# Patient Record
Sex: Female | Born: 1975 | Race: Black or African American | Hispanic: No | Smoking: Never smoker
Health system: Southern US, Community
[De-identification: ages and names within clinical notes are randomized; demographics above are authoritative.]

## PROBLEM LIST (undated history)

## (undated) DIAGNOSIS — K219 Gastro-esophageal reflux disease without esophagitis: Secondary | ICD-10-CM

## (undated) DIAGNOSIS — N912 Amenorrhea, unspecified: Secondary | ICD-10-CM

## (undated) DIAGNOSIS — Z8719 Personal history of other diseases of the digestive system: Secondary | ICD-10-CM

## (undated) DIAGNOSIS — R7303 Prediabetes: Secondary | ICD-10-CM

## (undated) DIAGNOSIS — G4711 Idiopathic hypersomnia with long sleep time: Secondary | ICD-10-CM

## (undated) DIAGNOSIS — F329 Major depressive disorder, single episode, unspecified: Secondary | ICD-10-CM

## (undated) DIAGNOSIS — T8859XA Other complications of anesthesia, initial encounter: Secondary | ICD-10-CM

## (undated) DIAGNOSIS — F32A Depression, unspecified: Secondary | ICD-10-CM

## (undated) DIAGNOSIS — I10 Essential (primary) hypertension: Secondary | ICD-10-CM

## (undated) DIAGNOSIS — G4730 Sleep apnea, unspecified: Secondary | ICD-10-CM

## (undated) DIAGNOSIS — D649 Anemia, unspecified: Secondary | ICD-10-CM

## (undated) HISTORY — PX: OTHER SURGICAL HISTORY: SHX169

## (undated) HISTORY — DX: Essential (primary) hypertension: I10

## (undated) HISTORY — DX: Amenorrhea, unspecified: N91.2

## (undated) HISTORY — PX: CHOLECYSTECTOMY, LAPAROSCOPIC: SHX56

## (undated) HISTORY — PX: TONSILLECTOMY: SUR1361

## (undated) HISTORY — PX: GASTRIC BYPASS: SHX52

---
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
## 2012-12-24 DIAGNOSIS — R200 Anesthesia of skin: Secondary | ICD-10-CM | POA: Insufficient documentation

## 2012-12-24 DIAGNOSIS — R519 Headache, unspecified: Secondary | ICD-10-CM | POA: Insufficient documentation

## 2013-07-06 HISTORY — PX: GASTRIC BYPASS: SHX52

## 2014-02-12 DIAGNOSIS — D649 Anemia, unspecified: Secondary | ICD-10-CM | POA: Insufficient documentation

## 2014-03-05 DIAGNOSIS — N83209 Unspecified ovarian cyst, unspecified side: Secondary | ICD-10-CM | POA: Insufficient documentation

## 2014-03-16 DIAGNOSIS — R197 Diarrhea, unspecified: Secondary | ICD-10-CM | POA: Insufficient documentation

## 2014-03-20 DIAGNOSIS — E876 Hypokalemia: Secondary | ICD-10-CM | POA: Insufficient documentation

## 2014-09-24 DIAGNOSIS — Z9884 Bariatric surgery status: Secondary | ICD-10-CM | POA: Insufficient documentation

## 2015-02-18 DIAGNOSIS — R87618 Other abnormal cytological findings on specimens from cervix uteri: Secondary | ICD-10-CM | POA: Insufficient documentation

## 2019-01-27 DIAGNOSIS — K458 Other specified abdominal hernia without obstruction or gangrene: Secondary | ICD-10-CM | POA: Insufficient documentation

## 2019-09-01 DIAGNOSIS — N393 Stress incontinence (female) (male): Secondary | ICD-10-CM | POA: Insufficient documentation

## 2019-09-01 DIAGNOSIS — E119 Type 2 diabetes mellitus without complications: Secondary | ICD-10-CM | POA: Insufficient documentation

## 2019-09-01 DIAGNOSIS — G4719 Other hypersomnia: Secondary | ICD-10-CM | POA: Insufficient documentation

## 2019-09-01 DIAGNOSIS — I10 Essential (primary) hypertension: Secondary | ICD-10-CM | POA: Insufficient documentation

## 2019-09-01 DIAGNOSIS — G4733 Obstructive sleep apnea (adult) (pediatric): Secondary | ICD-10-CM | POA: Insufficient documentation

## 2019-09-01 DIAGNOSIS — F32A Depression, unspecified: Secondary | ICD-10-CM | POA: Insufficient documentation

## 2019-09-01 DIAGNOSIS — M1990 Unspecified osteoarthritis, unspecified site: Secondary | ICD-10-CM | POA: Insufficient documentation

## 2019-10-17 ENCOUNTER — Ambulatory Visit: Payer: BC Managed Care – PPO | Admitting: Obstetrics & Gynecology

## 2019-10-17 ENCOUNTER — Encounter: Payer: Self-pay | Admitting: Obstetrics and Gynecology

## 2019-10-17 ENCOUNTER — Other Ambulatory Visit (HOSPITAL_COMMUNITY)
Admission: RE | Admit: 2019-10-17 | Discharge: 2019-10-17 | Disposition: A | Discharge status: Home / Self Care | Payer: BC Managed Care – PPO | Source: Ambulatory Visit | Attending: Obstetrics and Gynecology | Admitting: Obstetrics and Gynecology

## 2019-10-17 ENCOUNTER — Other Ambulatory Visit: Payer: Self-pay

## 2019-10-17 ENCOUNTER — Ambulatory Visit (INDEPENDENT_AMBULATORY_CARE_PROVIDER_SITE_OTHER): Payer: BC Managed Care – PPO | Admitting: Obstetrics and Gynecology

## 2019-10-17 VITALS — BP 148/90 | HR 72 | Ht 67.5 in | Wt 273.0 lb

## 2019-10-17 DIAGNOSIS — N939 Abnormal uterine and vaginal bleeding, unspecified: Secondary | ICD-10-CM

## 2019-10-17 DIAGNOSIS — N8501 Benign endometrial hyperplasia: Secondary | ICD-10-CM | POA: Diagnosis not present

## 2019-10-17 DIAGNOSIS — R87810 Cervical high risk human papillomavirus (HPV) DNA test positive: Secondary | ICD-10-CM | POA: Diagnosis not present

## 2019-10-17 DIAGNOSIS — R87610 Atypical squamous cells of undetermined significance on cytologic smear of cervix (ASC-US): Secondary | ICD-10-CM | POA: Diagnosis not present

## 2019-10-18 LAB — SURGICAL PATHOLOGY

## 2019-10-18 NOTE — Progress Notes (Signed)
Obstetrics & Gynecology Office Visit   Chief Complaint:  Chief Complaint  Patient presents with  . Colposcopy  . Endometrial Biopsy    History of Present Illness:Sharon Zimmerman is a 44 y.o. woman who presents today for consultation of abnormal pap smear at the request of Curtis Sites, MD at [facility] for continued surveillance for history of dysplasia. Last pap obtained on March 2021 revealed ASCUS HPV positive.  In addition the patient underwent an endometrial biopsy for AUB with result showing proliferative endometrium unable to rule out simple hyperplasia without atypia.  She does not report a history of prior abnormal paps or cervical procedures.  She has a long standing history of heavy menstrual cycles lasting up 7-10 days with worsening dysmenorrhea.   Her obstetric history is notable for 3 prior cesarean sections.  She has had a pelvic ultrasound several years ago for concern of uterine fibroids but reports that this was normal.    Review of Systems:Review of Systems  Constitutional: Negative.   Gastrointestinal: Positive for abdominal pain. Negative for blood in stool, constipation, diarrhea, heartburn, melena, nausea and vomiting.  Genitourinary: Negative.   Endo/Heme/Allergies: Does not bruise/bleed easily.     Past Medical History:  There are no problems to display for this patient.   Past Surgical History:  There are no problems to display for this patient.   Gynecologic History: Patient's last menstrual period was 10/09/2019 (exact date).  Obstetric History: G3P3  Family History:  No family history on file.  Social History:  Social History   Socioeconomic History  . Marital status: Unknown    Spouse name: Not on file  . Number of children: Not on file  . Years of education: Not on file  . Highest education level: Not on file  Occupational History  . Not on file  Tobacco Use  . Smoking status: Never Smoker  .  Smokeless tobacco: Never Used  Substance and Sexual Activity  . Alcohol use: Never  . Drug use: Never  . Sexual activity: Not Currently    Birth control/protection: Surgical    Comment: Bilateral Tubal Ligation  Other Topics Concern  . Not on file  Social History Narrative  . Not on file   Social Determinants of Health   Financial Resource Strain:   . Difficulty of Paying Living Expenses:   Food Insecurity:   . Worried About Charity fundraiser in the Last Year:   . Arboriculturist in the Last Year:   Transportation Needs:   . Film/video editor (Medical):   [name] Kitchen Lack of Transportation (Non-Medical):   Physical Activity:   . Days of Exercise per Week:   . Minutes of Exercise per Session:   Stress:   . Feeling of Stress :   Social Connections:   . Frequency of Communication with Friends and Family:   . Frequency of Social Gatherings with Friends and Family:   . Attends Religious Services:   . Active Member of Clubs or Organizations:   . Attends Archivist Meetings:   [name] Kitchen Marital Status:   Intimate Partner Violence:   . Fear of Current or Ex-Partner:   . Emotionally Abused:   [name] Kitchen Physically Abused:   . Sexually Abused:     Allergies:  Allergies  Allergen Reactions  . Lisinopril Cough    Medications: Prior to Admission medications   Medication Sig Start Date End Date Taking? Authorizing Provider  amLODipine (NORVASC) 10  MG tablet Take by mouth.   Yes [provider]  Armodafinil 250 MG tablet Take 250 mg by mouth at bedtime. 09/21/19  Yes [provider]  hydrochlorothiazide (HYDRODIURIL) 25 MG tablet Take 25 mg by mouth every morning. 07/30/19  Yes [provider]  ketoconazole (NIZORAL) 2 % cream APPLY TOPICALLY TO FEET EVERY DAY 09/20/19  Yes [provider]  losartan (COZAAR) 100 MG tablet Take 100 mg by mouth daily. 07/30/19  Yes [provider]  metoprolol succinate (TOPROL-XL) 25 MG 24 hr tablet Take 25 mg by  mouth daily. 07/30/19  Yes [provider]  pantoprazole (PROTONIX) 20 MG tablet Take 20 mg by mouth daily. 09/29/19  Yes [provider]  phentermine 15 MG capsule Take 15 mg by mouth every morning. 09/29/19  Yes [provider]  topiramate (TOPAMAX) 50 MG tablet  09/29/19  Yes [provider]  venlafaxine XR (EFFEXOR-XR) 150 MG 24 hr capsule Take 150 mg by mouth daily. 07/30/19  Yes [provider]    Physical Exam Vitals:  Vitals:   10/17/19 1039  BP: (!) 148/90  Pulse: 72   Patient's last menstrual period was 10/09/2019 (exact date).  General: NAD HEENT: normocephalic, anicteric Pulmonary: No increased work of breathing Genitourinary:  External: Normal external female genitalia.  Normal urethral meatus, normal  Bartholin's and Skene's glands.    Vagina: Normal vaginal mucosa, no evidence of prolapse.    Cervix: Grossly normal in appearance, no bleeding  Uterus: Non-enlarged, mobile, normal contour.  No CMT  Adnexa: ovaries non-enlarged, no adnexal masses  Rectal: deferred  Lymphatic: no evidence of inguinal lymphadenopathy Extremities: no edema, erythema, or tenderness Neurologic: Grossly intact Psychiatric: mood appropriate, affect full  Female chaperone present for pelvic and breast  portions of the physical exam    ENDOMETRIAL BIOPSY     The indications for endometrial biopsy were reviewed.   Risks of the biopsy including cramping, bleeding, infection, uterine perforation, inadequate specimen and need for additional procedures  were discussed. The patient states she understands and agrees to undergo procedure today. Consent was signed. Time out was performed. Urine HCG was negative. A Graves speculum was placed and the cervix was brought into view.  The cervix was prepped with Betadine. A single-toothed tenaculum was  placed on the anterior lip of the cervix for traction. A 3 mm pipelle was introduced through the cervix into the  endometrial cavity without difficulty to a depth of 8 cm, and a moderate amount of tissue was obtained, the resulting specime sent to pathology. The instruments were removed from the patient's vagina. Minimal bleeding from the cervix was noted. The patient tolerated the procedure well. Routine post-procedure instructions were given to the patient.  She will be contacted by phone one results become available.     GYNECOLOGY CLINIC COLPOSCOPY PROCEDURE NOTE  44 y.o. G3P3 here for colposcopy for ASCUS with POSITIVE high risk HPV  pap smear on 09/2019. Discussed underlying role for HPV infection in the development of cervical dysplasia, its natural history and progression/regression, need for surveillance.  Is the patient  pregnant: No LMP: Patient's last menstrual period was 10/09/2019 (exact date). Smoking status:  reports that she has never smoked. She has never used smokeless tobacco.     Patient given informed consent, signed copy in the chart, time out was performed.  The patient was position in dorsal lithotomy position. Speculum was placed the cervix was visualized.   After application of acetic acid colposcopic inspection  of the cervix was undertaken.   Colposcopy adequate, full visualization of transformation zone: Yes acetowhite lesion(s) noted at 1 o'clock; corresponding biopsies obtained.   ECC specimen obtained:  Yes  All specimens were labeled and sent to pathology.   Patient was given post procedure instructions.  Will follow up pathology and manage accordingly.  Routine preventative health maintenance measures emphasized.  Assessment: 44 y.o. G3P3 follow up for ASCUS HPV pap and AUB  Plan: Problem List Items Addressed This Visit    None    Visit Diagnoses    Abnormal uterine bleeding    -  Primary   Relevant Orders   Surgical pathology   US Transvaginal Non-OB   Simple endometrial hyperplasia       Relevant Orders   Surgical pathology   ASCUS with positive high risk  HPV cervical       Relevant Orders   Surgical pathology     1) ASCUS HPV positive pap  - I had a lengthly discussion with Marshea Zimmerman  regarding the cause of dysplasia of the lower genital tract (including immunosuppression in the setting of HPV exposure and tobacco exposure). I explained the potential for progression to invasive malignancy, the recurrent nature of these lesions (and the need for close continued followup). Results of today's pap will dictate need for further evaluation and follow up per ASCCP guidelines..  - We discussed that 80% of the population will have exposure to human papilloma virus (HPV) during their lifetime.  HPV is a large group of viruses, and are also the causative virus for common warts and genital warts.  The pap smear tests for 13 high risk HPV strains that have some association with cervical cancer, but do not cause visual lesion such as warts.  HPV type 16 and 18 have the highest association with cervical cancer.  The vast majority of HPV infections will be uncomplicated at clear spontaneously in 12-18 months in non-immunocompromised patients.  Patient with compromised immune systems, those taking immunosuppressive drugs, or smoker have shown to have a lower clearance rate and higher persistence of HPV infection.    Currently there are no FDA approved treatments to promote HPV clearance.  Gardasil vaccination is available to prevent HPV infection, but this is only beneficial pre-exposure.  Abstaining from intercourse will not increase clearance  Lastly we stressed that if properly followed HPV should not lead to cervical cancer.   The goal of screening is to identify patient who develop precancerous lesions of the cervix and treat these prior to progression to frank cervical cancer.  The incidence of cervical cancer is 7 cases per 100,000 women in the [place] a year.  This relatively low rate is in part due to universal screening as well as vaccination efforts.     - She is comfortable with the plan and had her questions answered.  2) Discussed management options for abnormal uterine bleeding including expectant, NSAIDs, tranexamic acid (Lysteda), oral progesterone (Provera, norethindrone, megace), Depo Provera, Levonorgestrel containing IUD, endometrial ablation (Novasure) or hysterectomy as definitive surgical management.  Discussed risks and benefits of each method.   Final management decision will hinge on results of patient's work up and whether an underlying etiology for the patients bleeding symptoms can be discerned.  We will conduct a basic work up examining using the PALM-COIEN classification system.  Printed patient education handouts were given to the patient to review at home.  Bleeding precautions reviewed.  - endometrial biopsy obtained today to see if definitive  evidence of hyperplasia if present will need to start on progestin therapy.  IUD would be an option although she has significant discomfort with endometrial biopsy although there was no difficulty in entering the uterine cavity - given her history of 3 prior cesarean section adenomyosis is high in the differential -     - A copy of this note was sent to the patient's referring provider  - Return in about 1 week (around 10/24/2019) for TVUS .   Malachy Mood, MD, [facility], [facility] 10/18/2019, 8:46 AM

## 2019-10-30 ENCOUNTER — Other Ambulatory Visit: Payer: Self-pay | Admitting: Physician Assistant

## 2019-10-30 ENCOUNTER — Encounter: Payer: Self-pay | Admitting: Obstetrics and Gynecology

## 2019-10-30 ENCOUNTER — Ambulatory Visit (INDEPENDENT_AMBULATORY_CARE_PROVIDER_SITE_OTHER): Payer: BC Managed Care – PPO | Admitting: Obstetrics and Gynecology

## 2019-10-30 ENCOUNTER — Ambulatory Visit (INDEPENDENT_AMBULATORY_CARE_PROVIDER_SITE_OTHER): Payer: BC Managed Care – PPO

## 2019-10-30 ENCOUNTER — Other Ambulatory Visit: Payer: Self-pay

## 2019-10-30 VITALS — BP 148/90 | HR 93 | Ht 67.5 in | Wt 273.0 lb

## 2019-10-30 DIAGNOSIS — N939 Abnormal uterine and vaginal bleeding, unspecified: Secondary | ICD-10-CM | POA: Diagnosis not present

## 2019-10-30 DIAGNOSIS — L988 Other specified disorders of the skin and subcutaneous tissue: Secondary | ICD-10-CM

## 2019-10-30 NOTE — Progress Notes (Signed)
Gynecology Ultrasound Follow Up  Chief Complaint:  Chief Complaint  Patient presents with  . Follow-up    GYN Ultrasound     History of Present Illness: Patient is a 44 y.o. female who presents today for ultrasound evaluation of abnormal uterine bleeding.  Ultrasound demonstrates the following findgins Adnexa: no masses seen Uterus: Non-enlarged, no evidence of fibroids with endometrial stripe significantly thickened but devoid of focal abnormalities such as polyps Additional: No free fluid  Endometrial biopsy at PCP showed possible simple hyperplasia without atypia.  Sample obtained at the time of colposcopy 10/17/2019 showed proliferative endometrium negative for hyperplasia or malignancy.  Review of Systems: Review of Systems  Constitutional: Negative.   Gastrointestinal: Negative.   Genitourinary: Negative.     Past Medical History:  Past Medical History:  Diagnosis Date  . Hypertension     Past Surgical History:  Past Surgical History:  Procedure Laterality Date  . CESAREAN SECTION  x3  . CHOLECYSTECTOMY, LAPAROSCOPIC    . GASTRIC BYPASS    . left oopherectomy    . TONSILLECTOMY    . torn Rotator cuff      Gynecologic History:  Patient's last menstrual period was 10/09/2019 (exact date).  Family History:  History reviewed. No pertinent family history.  Social History:  Social History   Socioeconomic History  . Marital status: Unknown    Spouse name: Not on file  . Number of children: Not on file  . Years of education: Not on file  . Highest education level: Not on file  Occupational History  . Not on file  Tobacco Use  . Smoking status: Never Smoker  . Smokeless tobacco: Never Used  Substance and Sexual Activity  . Alcohol use: Never  . Drug use: Never  . Sexual activity: Not Currently    Birth control/protection: Surgical    Comment: Bilateral Tubal Ligation  Other Topics Concern  . Not on file  Social History Narrative  . Not on file     Social Determinants of Health   Financial Resource Strain:   . Difficulty of Paying Living Expenses:   Food Insecurity:   . Worried About Charity fundraiser in the Last Year:   . Arboriculturist in the Last Year:   Transportation Needs:   . Film/video editor (Medical):   [name] Kitchen Lack of Transportation (Non-Medical):   Physical Activity:   . Days of Exercise per Week:   . Minutes of Exercise per Session:   Stress:   . Feeling of Stress :   Social Connections:   . Frequency of Communication with Friends and Family:   . Frequency of Social Gatherings with Friends and Family:   . Attends Religious Services:   . Active Member of Clubs or Organizations:   . Attends Archivist Meetings:   [name] Kitchen Marital Status:   Intimate Partner Violence:   . Fear of Current or Ex-Partner:   . Emotionally Abused:   [name] Kitchen Physically Abused:   . Sexually Abused:     Allergies:  Allergies  Allergen Reactions  . Lisinopril Cough    Medications: Prior to Admission medications   Medication Sig Start Date End Date Taking? Authorizing Provider  amLODipine (NORVASC) 10 MG tablet Take by mouth.    [provider]  Armodafinil 250 MG tablet Take 250 mg by mouth at bedtime. 09/21/19   [provider]  hydrochlorothiazide (HYDRODIURIL) 25 MG tablet Take 25 mg by mouth every morning. 07/30/19  [provider]  ketoconazole (NIZORAL) 2 % cream APPLY TOPICALLY TO FEET EVERY DAY 09/20/19   [provider]  losartan (COZAAR) 100 MG tablet Take 100 mg by mouth daily. 07/30/19   [provider]  metoprolol succinate (TOPROL-XL) 25 MG 24 hr tablet Take 25 mg by mouth daily. 07/30/19   [provider]  pantoprazole (PROTONIX) 20 MG tablet Take 20 mg by mouth daily. 09/29/19   [provider]  phentermine 15 MG capsule Take 15 mg by mouth every morning. 09/29/19   [provider]  topiramate (TOPAMAX) 50 MG tablet  09/29/19   [provider]  venlafaxine XR (EFFEXOR-XR) 150 MG 24 hr capsule Take 150 mg by mouth daily. 07/30/19   [provider]    Physical Exam Vitals: Blood pressure (!) 148/90, pulse 93, height 5' 7.5" (1.715 m), weight 273 lb (123.8 kg), last menstrual period 10/09/2019.  General: NAD HEENT: normocephalic, anicteric Pulmonary: No increased work of breathing Extremities: no edema, erythema, or tenderness Neurologic: Grossly intact, normal gait Psychiatric: mood appropriate, affect full  US Transvaginal Non-OB  Result Date: 10/30/2019 Patient Name: Sharon Zimmerman DOB: [birth year] MRN: [id number] ULTRASOUND REPORT Location: [facility] Date of Service: 10/30/2019 Indications:Abnormal Uterine Bleeding Findings: The uterus is anteverted and measures 9.3 x 6.7 x 5.7 cm. Echo texture is heterogenous without evidence of focal masses. The Endometrium measures 23.0 mm. Right Ovary measures 5.1 x 3.4 x 2.7 cm. It is normal in appearance. There is a a corpus luteum in the right ovary. Left oophorectomy. Survey of the adnexa demonstrates no adnexal masses. There is no free fluid in the cul de sac. Impression: 1. The uterus is heterogenous. The endometrium is thick and heterogeneous. No definite fibroids seen. 2. Normal appearing right ovary. 3. Left oophorectomy. Recommendations: 1.Clinical correlation with the patient's History and Physical Exam. Gweneth Dimitri, RT Images reviewed. The endometrium is markedly thickened on today's study. Malachy Mood, MD, [facility], [facility] 10/30/2019, 9:54 AM     Assessment: 44 y.o. G3P3 No problem-specific Assessment & Plan notes found for this encounter.   Plan: Problem List Items Addressed This Visit    None    Visit Diagnoses    Abnormal uterine bleeding    -  Primary   Relevant Orders   TSH+Prl+FSH+TestT+LH+DHEA S...      1) Normal ultrasound other than thickened endometrium. Biopsy negative at time of colposcopy, no evidence  of polyps.  Anticipated next cycle 11/09/2019.  Discussed expectant management, progestins, IUD, D&C +/- IUD or ablation, hysterectomy.  Patient interested in trial of IUD.  Will also obtain some additional hormonal work up given normal ultrasound and endometrial biopsy. - plan on IUD insertion at time of next menses  2) A total of 15 minutes were spent in face-to-face contact with the patient during this encounter with over half of that time devoted to counseling and coordination of care.  3) Return call with next menses for Mirena IUD insertion.    Malachy Mood, MD, [facility], [facility] 10/30/2019, 10:14 AM

## 2019-11-07 LAB — TSH+PRL+FSH+TESTT+LH+DHEA S...
17-Hydroxyprogesterone: 96 ng/dL
Androstenedione: 57 ng/dL (ref 41–262)
DHEA-SO4: 153 ug/dL (ref 57.3–279.2)
FSH: 7.4 m[IU]/mL
LH: 9.1 m[IU]/mL
Prolactin: 8.3 ng/mL (ref 4.8–23.3)
TSH: 1.35 u[IU]/mL (ref 0.450–4.500)
Testosterone, Free: 0.9 pg/mL (ref 0.0–4.2)
Testosterone: 21 ng/dL (ref 8–48)

## 2019-11-10 ENCOUNTER — Other Ambulatory Visit: Payer: Self-pay | Admitting: Physician Assistant

## 2019-11-10 ENCOUNTER — Ambulatory Visit
Admission: RE | Admit: 2019-11-10 | Discharge: 2019-11-10 | Disposition: A | Discharge status: Home / Self Care | Payer: BC Managed Care – PPO | Source: Ambulatory Visit | Attending: Physician Assistant | Admitting: Physician Assistant

## 2019-11-10 DIAGNOSIS — L988 Other specified disorders of the skin and subcutaneous tissue: Secondary | ICD-10-CM

## 2019-11-13 ENCOUNTER — Other Ambulatory Visit: Payer: Self-pay

## 2019-11-13 ENCOUNTER — Ambulatory Visit (INDEPENDENT_AMBULATORY_CARE_PROVIDER_SITE_OTHER): Payer: BC Managed Care – PPO | Admitting: Obstetrics and Gynecology

## 2019-11-13 ENCOUNTER — Telehealth: Payer: Self-pay | Admitting: Obstetrics and Gynecology

## 2019-11-13 ENCOUNTER — Encounter: Payer: Self-pay | Admitting: Obstetrics and Gynecology

## 2019-11-13 VITALS — BP 146/96 | HR 77 | Ht 67.5 in | Wt 276.0 lb

## 2019-11-13 DIAGNOSIS — Z30430 Encounter for insertion of intrauterine contraceptive device: Secondary | ICD-10-CM

## 2019-11-13 NOTE — Progress Notes (Signed)
   GYNECOLOGY OFFICE PROCEDURE NOTE  Sharon Zimmerman is a 44 y.o. G3P3 here for a Mirena IUD insertion. No GYN concerns.  Patient's last menstrual period was 11/10/2019 (exact date)..  The indication for her IUD is cycle control.  IUD Insertion Procedure Note Patient identified, informed consent performed, consent signed.   Discussed risks of irregular bleeding, cramping, infection, malpositioning, expulsion or uterine perforation of the IUD (1:1000 placements)  which may require further procedure such as laparoscopy.  IUD while effective at preventing pregnancy do not prevent transmission of sexually transmitted diseases and use of barrier methods for this purpose was discussed. Time out was performed.  Urine pregnancy test negative.  Speculum placed in the vagina.  Cervix visualized.  Cleaned with Betadine x 2.  Grasped anteriorly with a single tooth tenaculum.  Uterus sounded to 8 cm. IUD placed per manufacturer's recommendations.  Strings trimmed to 3 cm. Tenaculum was removed, good hemostasis noted.  Patient tolerated procedure well.   Patient was given post-procedure instructions.  She was advised to have backup contraception for one week.  Patient was also asked to check IUD strings periodically and follow up in 6 weeks for IUD check.   Malachy Mood, MD, [facility], [place]

## 2019-11-13 NOTE — Telephone Encounter (Signed)
Noted. Mirena reserved for this patient. 

## 2019-11-13 NOTE — Telephone Encounter (Signed)
Patient is schedule for Monday, 11/13/19 at 1:50 with AMS for Mirena placement

## 2019-11-16 ENCOUNTER — Telehealth: Payer: Self-pay | Admitting: Hematology and Oncology

## 2019-11-16 NOTE — Telephone Encounter (Signed)
Received an urgent referral from [place] for severe iron deficency. Ms. Sharon Zimmerman has been cld and scheduled to see Dr. Alvy Bimler on 5/17 at 1:50pm. Pt aware to arrive 15 minutes early.

## 2019-11-20 ENCOUNTER — Inpatient Hospital Stay: Payer: BC Managed Care – PPO | Attending: Hematology and Oncology | Admitting: Hematology and Oncology

## 2019-11-20 ENCOUNTER — Other Ambulatory Visit: Payer: Self-pay

## 2019-11-20 ENCOUNTER — Telehealth: Payer: Self-pay

## 2019-11-20 NOTE — Telephone Encounter (Signed)
PROGRESS NOTE: Patients appointment today was scheduled for 1:50pm. Patient arrived to appointment late due to not knowing where to go. Patient wasn't checked in until 2:20p however Dr Alvy Bimler is now unable to see patient due to not having enough time and having another patient who is here for their appointment. Offered patient option of being able to reschedule with Dr Alvy Bimler tomorrow @12 :40p or Friday @2 :30pm since patient stated that was her next day off of work. Let patient know that she would need to arrive to appointment to check in 30 minutes early. Patient declined both new appointment options and stated that she would call for another appointment.

## 2019-11-20 NOTE — Progress Notes (Deleted)
Patients appointment today was scheduled for 1:50pm. Patient arrived to appointment late due to not knowing where to go. Patient wasn't checked in until 2:20p however Dr Alvy Bimler is now unable to see patient due to not having enough time and having another patient who is here for their appointment. Offered patient option of being able to reschedule with Dr Alvy Bimler tomorrow @12 :40p or Friday @2 :30pm since patient stated that was her next day off of work. Let patient know that she would need to arrive to appointment to check in 30 minutes early. Patient declined both new appointment options and stated that she would call for another appointment.

## 2019-11-28 ENCOUNTER — Ambulatory Visit (HOSPITAL_COMMUNITY): Payer: BC Managed Care – PPO | Admitting: Hematology

## 2019-12-26 ENCOUNTER — Encounter: Payer: Self-pay | Admitting: Obstetrics and Gynecology

## 2019-12-26 ENCOUNTER — Other Ambulatory Visit: Payer: Self-pay

## 2019-12-26 ENCOUNTER — Ambulatory Visit (INDEPENDENT_AMBULATORY_CARE_PROVIDER_SITE_OTHER): Payer: BC Managed Care – PPO | Admitting: Obstetrics and Gynecology

## 2019-12-26 ENCOUNTER — Ambulatory Visit (INDEPENDENT_AMBULATORY_CARE_PROVIDER_SITE_OTHER): Payer: BC Managed Care – PPO

## 2019-12-26 VITALS — BP 136/80 | HR 72 | Ht 67.5 in | Wt 281.0 lb

## 2019-12-26 DIAGNOSIS — Z30431 Encounter for routine checking of intrauterine contraceptive device: Secondary | ICD-10-CM | POA: Diagnosis not present

## 2019-12-26 DIAGNOSIS — N939 Abnormal uterine and vaginal bleeding, unspecified: Secondary | ICD-10-CM | POA: Diagnosis not present

## 2019-12-26 NOTE — Progress Notes (Signed)
Obstetrics & Gynecology Office Visit   Chief Complaint:  Chief Complaint  Patient presents with  . IUD check    bleeding and cramping    History of Present Illness: 44 y.o. patient presenting for follow up of Mirena IUD placement 6 weeks ago.  The indication for her IUD was cycle control.  She denies any complications since her IUD placement.  Still having some vaginal bleeding and cramping since initial IUD placement.  Review of Systems: Review of Systems  Constitutional: Negative.   Gastrointestinal: Negative.   Genitourinary: Negative.     Past Medical History:  Past Medical History:  Diagnosis Date  . Hypertension     Past Surgical History:  Past Surgical History:  Procedure Laterality Date  . CESAREAN SECTION  x3  . CHOLECYSTECTOMY, LAPAROSCOPIC    . GASTRIC BYPASS    . left oopherectomy    . TONSILLECTOMY    . torn Rotator cuff      Gynecologic History: Patient's last menstrual period was 12/01/2019 (exact date).  Obstetric History: G3P3  Family History:  No family history on file.  Social History:  Social History   Socioeconomic History  . Marital status: Unknown    Spouse name: Not on file  . Number of children: Not on file  . Years of education: Not on file  . Highest education level: Not on file  Occupational History  . Not on file  Tobacco Use  . Smoking status: Never Smoker  . Smokeless tobacco: Never Used  Vaping Use  . Vaping Use: Never used  Substance and Sexual Activity  . Alcohol use: Never  . Drug use: Never  . Sexual activity: Not Currently    Birth control/protection: Surgical    Comment: Bilateral Tubal Ligation  Other Topics Concern  . Not on file  Social History Narrative  . Not on file   Social Determinants of Health   Financial Resource Strain:   . Difficulty of Paying Living Expenses:   Food Insecurity:   . Worried About Charity fundraiser in the Last Year:   . Arboriculturist in the Last Year:     Transportation Needs:   . Film/video editor (Medical):   [name] Kitchen Lack of Transportation (Non-Medical):   Physical Activity:   . Days of Exercise per Week:   . Minutes of Exercise per Session:   Stress:   . Feeling of Stress :   Social Connections:   . Frequency of Communication with Friends and Family:   . Frequency of Social Gatherings with Friends and Family:   . Attends Religious Services:   . Active Member of Clubs or Organizations:   . Attends Archivist Meetings:   [name] Kitchen Marital Status:   Intimate Partner Violence:   . Fear of Current or Ex-Partner:   . Emotionally Abused:   [name] Kitchen Physically Abused:   . Sexually Abused:     Allergies:  Allergies  Allergen Reactions  . Lisinopril Cough    Medications: Prior to Admission medications   Medication Sig Start Date End Date Taking? Authorizing Provider  amLODipine (NORVASC) 10 MG tablet Take by mouth.    [provider]  Armodafinil 250 MG tablet Take 250 mg by mouth at bedtime. 09/21/19   [provider]  hydrochlorothiazide (HYDRODIURIL) 25 MG tablet Take 25 mg by mouth every morning. 07/30/19   [provider]  ketoconazole (NIZORAL) 2 % cream APPLY TOPICALLY TO FEET EVERY DAY 09/20/19  [provider]  losartan (COZAAR) 100 MG tablet Take 100 mg by mouth daily. 07/30/19   [provider]  metoprolol succinate (TOPROL-XL) 25 MG 24 hr tablet Take 25 mg by mouth daily. 07/30/19   [provider]  pantoprazole (PROTONIX) 20 MG tablet Take 20 mg by mouth daily. 09/29/19   [provider]  phentermine 15 MG capsule Take 15 mg by mouth every morning. 09/29/19   [provider]  topiramate (TOPAMAX) 50 MG tablet  09/29/19   [provider]  venlafaxine XR (EFFEXOR-XR) 150 MG 24 hr capsule Take 150 mg by mouth daily. 07/30/19   [provider]    Physical Exam Vitals: Blood pressure 136/80, pulse 72, height 5' 7.5" (1.715 m), weight 281 lb  (127.5 kg), last menstrual period 12/01/2019.  Patient's last menstrual period was 12/01/2019 (exact date).  General: NAD HEENT: normocephalic, anicteric Pulmonary: No increased work of breathing  Genitourinary:  External: Normal external female genitalia.  Normal urethral meatus, normal  Bartholin's and Skene's glands.    Vagina: Normal vaginal mucosa, no evidence of prolapse.    Cervix: Grossly normal in appearance, no bleeding, IUD strings visualized 2cm  Uterus: Non-enlarged, mobile, normal contour.  No CMT  Adnexa: ovaries non-enlarged, no adnexal masses  Rectal: deferred  Lymphatic: no evidence of inguinal lymphadenopathy Extremities: no edema, erythema, or tenderness Neurologic: Grossly intact Psychiatric: mood appropriate, affect full  Female chaperone present for pelvic and breast  portions of the physical exam  US Transvaginal Non-OB  Result Date: 12/26/2019 Patient Name: Sharon Zimmerman DOB: 03-23-1976 MRN: [id number] ULTRASOUND REPORT Location: [facility] Date of Service: 12/26/2019 Indications:Abnormal Uterine Bleeding Findings: The uterus is anteverted and measures 10.0 x 6.4 x 5.4 cm. Echo texture is heterogenous without evidence of focal masses. The Endometrium measures 7.4 mm. The endometrium is somewhat hard to define. The IUD is correctly placed within the uterus. Right Ovary measures 3.5 x 3.2 x 2.3 cm. It is normal in appearance. Left oophorectomy. Survey of the adnexa demonstrates no adnexal masses. There is no free fluid in the cul de sac. Impression: 1. The IUD is correctly placed within the uterus. 2. The endometrium is slightly hard to define and somewhat thick. 3. Normal appearing right ovary. 4. Left oophorectomy Recommendations: 1.Clinical correlation with the patient's History and Physical Exam. Gweneth Dimitri, RT Images reviewed.  Normal GYN study without visualized pathology. IUD in proper position. Malachy Mood, MD, [facility], [facility] 12/26/2019, 10:50 AM     Assessment: 44 y.o. G3P3 No problem-specific Assessment & Plan notes found for this encounter.   Plan: Problem List Items Addressed This Visit    None    Visit Diagnoses    Abnormal uterine bleeding    -  Primary   Relevant Orders   US Transvaginal Non-OB   Encounter for routine checking of intrauterine contraceptive device (IUD)       Relevant Orders   US Transvaginal Non-OB       1.  The patient was given instructions to check her IUD strings monthly and call with any problems or concerns.  She should call for fevers, chills, abnormal vaginal discharge, pelvic pain, or other complaints.  2.   IUDs while effective at preventing pregnancy do not prevent transmission of sexually transmitted diseases and use of barrier methods for this purpose was discussed.  Low overall incidence of failure with 99.7% efficacy rate in typical use.  The patient has not contraindication to  IUD placement.  3.  She will return  in 4 week to reassess bleeding pattern lining thinned out significantly 7.69mm compared to 53mm originally with IUD in good location within the uterine cavity.  4) A total of 15 minutes were spent in face-to-face contact with the patient during this encounter with over half of that time devoted to counseling and coordination of care.  5) Return in about 1 week (around 01/02/2020) for TVUS IUD location and follow up (can be today or later this week).   Malachy Mood, MD, [facility], [facility] 12/26/2019, 9:25 AM

## 2020-01-23 ENCOUNTER — Other Ambulatory Visit: Payer: Self-pay

## 2020-01-23 ENCOUNTER — Ambulatory Visit (INDEPENDENT_AMBULATORY_CARE_PROVIDER_SITE_OTHER): Payer: BC Managed Care – PPO | Admitting: Obstetrics and Gynecology

## 2020-01-23 ENCOUNTER — Encounter: Payer: Self-pay | Admitting: Obstetrics and Gynecology

## 2020-01-23 VITALS — BP 138/90 | HR 83 | Wt 279.0 lb

## 2020-01-23 DIAGNOSIS — Z30431 Encounter for routine checking of intrauterine contraceptive device: Secondary | ICD-10-CM | POA: Diagnosis not present

## 2020-01-23 DIAGNOSIS — N939 Abnormal uterine and vaginal bleeding, unspecified: Secondary | ICD-10-CM | POA: Diagnosis not present

## 2020-01-23 NOTE — H&P (View-Only) (Signed)
Obstetrics & Gynecology Office Visit   Chief Complaint:  Chief Complaint  Patient presents with  . Follow-up    IUD removal, continuously bleeding    History of Present Illness: 44 y.o. patient presenting for follow up of Mirena IUD placement on 11/13/2019.  The indication for her IUD was cycle control.  She admits to any complications since her IUD placement.  Still having daily bleedingl. She is able to feel strings.    Endometrial biopsy 10/17/2019 benign proliferative endometrium  Colposcopy 10/17/2019 [facility] I  Ultrasound 12/26/2019 normal, confirming correct IUD placement  Review of Systems: Review of Systems  Constitutional: Negative.   Gastrointestinal: Negative.   Genitourinary: Negative.     Past Medical History:  Past Medical History:  Diagnosis Date  . Hypertension     Past Surgical History:  Past Surgical History:  Procedure Laterality Date  . CESAREAN SECTION  x3  . CHOLECYSTECTOMY, LAPAROSCOPIC    . GASTRIC BYPASS    . left oopherectomy    . TONSILLECTOMY    . torn Rotator cuff      Gynecologic History: No LMP recorded. (Menstrual status: IUD).  Obstetric History: G3P3  Family History:  History reviewed. No pertinent family history.  Social History:  Social History   Socioeconomic History  . Marital status: Unknown    Spouse name: Not on file  . Number of children: Not on file  . Years of education: Not on file  . Highest education level: Not on file  Occupational History  . Not on file  Tobacco Use  . Smoking status: Never Smoker  . Smokeless tobacco: Never Used  Vaping Use  . Vaping Use: Never used  Substance and Sexual Activity  . Alcohol use: Never  . Drug use: Never  . Sexual activity: Not Currently    Birth control/protection: Surgical    Comment: Bilateral Tubal Ligation  Other Topics Concern  . Not on file  Social History Narrative  . Not on file   Social Determinants of Health   Financial Resource Strain:   .  Difficulty of Paying Living Expenses:   Food Insecurity:   . Worried About Charity fundraiser in the Last Year:   . Arboriculturist in the Last Year:   Transportation Needs:   . Film/video editor (Medical):   [name] Kitchen Lack of Transportation (Non-Medical):   Physical Activity:   . Days of Exercise per Week:   . Minutes of Exercise per Session:   Stress:   . Feeling of Stress :   Social Connections:   . Frequency of Communication with Friends and Family:   . Frequency of Social Gatherings with Friends and Family:   . Attends Religious Services:   . Active Member of Clubs or Organizations:   . Attends Archivist Meetings:   [name] Kitchen Marital Status:   Intimate Partner Violence:   . Fear of Current or Ex-Partner:   . Emotionally Abused:   [name] Kitchen Physically Abused:   . Sexually Abused:     Allergies:  Allergies  Allergen Reactions  . Lisinopril Cough    Medications: Prior to Admission medications   Medication Sig Start Date End Date Taking? Authorizing Provider  amLODipine (NORVASC) 10 MG tablet Take by mouth.   Yes [provider]  Armodafinil 250 MG tablet Take 250 mg by mouth at bedtime. 09/21/19  Yes [provider]  hydrochlorothiazide (HYDRODIURIL) 25 MG tablet Take 25 mg by mouth every morning. 07/30/19  Yes [provider]  ketoconazole (NIZORAL) 2 % cream APPLY TOPICALLY TO FEET EVERY DAY 09/20/19  Yes [provider]  losartan (COZAAR) 100 MG tablet Take 100 mg by mouth daily. 07/30/19  Yes [provider]  metoprolol succinate (TOPROL-XL) 25 MG 24 hr tablet Take 25 mg by mouth daily. 07/30/19  Yes [provider]  pantoprazole (PROTONIX) 20 MG tablet Take 20 mg by mouth daily. 09/29/19  Yes [provider]  QSYMIA 7.5-46 MG CP24 Take 1 capsule by mouth daily. 11/10/19  Yes [provider]  venlafaxine XR (EFFEXOR-XR) 150 MG 24 hr capsule Take 150 mg by mouth daily. 07/30/19  Yes [provider]     Physical Exam Blood pressure 138/90, pulse 83, weight 279 lb (126.6 kg). No LMP recorded. (Menstrual status: IUD).  General: NAD, well nourished, appears stated age 68: normocephalic, anicteric Pulmonary: No increased work of breathing, CTAB Cardiovascular: RRR Abdomen: soft, non-tender, non-distended Extremities: no edema, erythema, or tenderness Neurologic: Grossly intact Psychiatric: mood appropriate, affect full   Assessment: 44 y.o. G3P3 follow up continued AUB on Mirena IUD  Plan: Problem List Items Addressed This Visit    None    Visit Diagnoses    Abnormal uterine bleeding    -  Primary   IUD check up           1.  AUB - continued AUB on Mirena IUD.  Discussed continued expectant management, IUD removal, switching to oral progestin/depo provera, or Novasure endometrial ablation  - Hysteroscopy, mirena iud removal, and novasure - Consents signed today. - Surgery scheduling request  2) A total of 15 minutes were spent in face-to-face contact with the patient during this encounter with over half of that time devoted to counseling and coordination of care.  3) Return will be called with srugery date.   Malachy Mood, MD, [facility], [place]

## 2020-01-23 NOTE — Progress Notes (Signed)
Obstetrics & Gynecology Office Visit   Chief Complaint:  Chief Complaint  Patient presents with  . Follow-up    IUD removal, continuously bleeding    History of Present Illness: 44 y.o. patient presenting for follow up of Mirena IUD placement on 11/13/2019.  The indication for her IUD was cycle control.  She admits to any complications since her IUD placement.  Still having daily bleedingl. She is able to feel strings.    Endometrial biopsy 10/17/2019 benign proliferative endometrium  Colposcopy 10/17/2019 [facility] I  Ultrasound 12/26/2019 normal, confirming correct IUD placement  Review of Systems: Review of Systems  Constitutional: Negative.   Gastrointestinal: Negative.   Genitourinary: Negative.     Past Medical History:  Past Medical History:  Diagnosis Date  . Hypertension     Past Surgical History:  Past Surgical History:  Procedure Laterality Date  . CESAREAN SECTION  x3  . CHOLECYSTECTOMY, LAPAROSCOPIC    . GASTRIC BYPASS    . left oopherectomy    . TONSILLECTOMY    . torn Rotator cuff      Gynecologic History: No LMP recorded. (Menstrual status: IUD).  Obstetric History: G3P3  Family History:  History reviewed. No pertinent family history.  Social History:  Social History   Socioeconomic History  . Marital status: Unknown    Spouse name: Not on file  . Number of children: Not on file  . Years of education: Not on file  . Highest education level: Not on file  Occupational History  . Not on file  Tobacco Use  . Smoking status: Never Smoker  . Smokeless tobacco: Never Used  Vaping Use  . Vaping Use: Never used  Substance and Sexual Activity  . Alcohol use: Never  . Drug use: Never  . Sexual activity: Not Currently    Birth control/protection: Surgical    Comment: Bilateral Tubal Ligation  Other Topics Concern  . Not on file  Social History Narrative  . Not on file   Social Determinants of Health   Financial Resource Strain:   .  Difficulty of Paying Living Expenses:   Food Insecurity:   . Worried About Charity fundraiser in the Last Year:   . Arboriculturist in the Last Year:   Transportation Needs:   . Film/video editor (Medical):   [name] Kitchen Lack of Transportation (Non-Medical):   Physical Activity:   . Days of Exercise per Week:   . Minutes of Exercise per Session:   Stress:   . Feeling of Stress :   Social Connections:   . Frequency of Communication with Friends and Family:   . Frequency of Social Gatherings with Friends and Family:   . Attends Religious Services:   . Active Member of Clubs or Organizations:   . Attends Archivist Meetings:   [name] Kitchen Marital Status:   Intimate Partner Violence:   . Fear of Current or Ex-Partner:   . Emotionally Abused:   [name] Kitchen Physically Abused:   . Sexually Abused:     Allergies:  Allergies  Allergen Reactions  . Lisinopril Cough    Medications: Prior to Admission medications   Medication Sig Start Date End Date Taking? Authorizing Provider  amLODipine (NORVASC) 10 MG tablet Take by mouth.   Yes [provider]  Armodafinil 250 MG tablet Take 250 mg by mouth at bedtime. 09/21/19  Yes [provider]  hydrochlorothiazide (HYDRODIURIL) 25 MG tablet Take 25 mg by mouth every morning. 07/30/19  Yes [provider]  ketoconazole (NIZORAL) 2 % cream APPLY TOPICALLY TO FEET EVERY DAY 09/20/19  Yes [provider]  losartan (COZAAR) 100 MG tablet Take 100 mg by mouth daily. 07/30/19  Yes [provider]  metoprolol succinate (TOPROL-XL) 25 MG 24 hr tablet Take 25 mg by mouth daily. 07/30/19  Yes [provider]  pantoprazole (PROTONIX) 20 MG tablet Take 20 mg by mouth daily. 09/29/19  Yes [provider]  QSYMIA 7.5-46 MG CP24 Take 1 capsule by mouth daily. 11/10/19  Yes [provider]  venlafaxine XR (EFFEXOR-XR) 150 MG 24 hr capsule Take 150 mg by mouth daily. 07/30/19  Yes [provider]     Physical Exam Blood pressure 138/90, pulse 83, weight 279 lb (126.6 kg). No LMP recorded. (Menstrual status: IUD).  General: NAD, well nourished, appears stated age 32: normocephalic, anicteric Pulmonary: No increased work of breathing, CTAB Cardiovascular: RRR Abdomen: soft, non-tender, non-distended Extremities: no edema, erythema, or tenderness Neurologic: Grossly intact Psychiatric: mood appropriate, affect full   Assessment: 44 y.o. G3P3 follow up continued AUB on Mirena IUD  Plan: Problem List Items Addressed This Visit    None    Visit Diagnoses    Abnormal uterine bleeding    -  Primary   IUD check up           1.  AUB - continued AUB on Mirena IUD.  Discussed continued expectant management, IUD removal, switching to oral progestin/depo provera, or Novasure endometrial ablation  - Hysteroscopy, mirena iud removal, and novasure - Consents signed today. - Surgery scheduling request  2) A total of 15 minutes were spent in face-to-face contact with the patient during this encounter with over half of that time devoted to counseling and coordination of care.  3) Return will be called with srugery date.   Malachy Mood, MD, [facility], [place]

## 2020-01-30 ENCOUNTER — Telehealth: Payer: Self-pay

## 2020-01-30 NOTE — Telephone Encounter (Signed)
IUD is in correct position based on prior exam and ultrasound, we can take it out anytime that I have an opening

## 2020-01-30 NOTE — Telephone Encounter (Signed)
Patient is scheduled for 02/08/20 with AMS in [facility]

## 2020-01-30 NOTE — Telephone Encounter (Signed)
Pt calling stating she is having bleeding and pain on right side. She states she was supposed to have the IUD taken out on 7/20 at her visit with AMS. However, the plan was to wait it out. She has since then had bleeding which has lightened up but the cramping is still on her right side. She has noticed nausea, however, is not concerned about that as much d/t her eating habits has changed.   She is aware AMS is not in the office today but that I will send him a message.

## 2020-01-30 NOTE — Telephone Encounter (Signed)
Please schedule at next available appointment. Not urgent

## 2020-02-01 ENCOUNTER — Telehealth: Payer: Self-pay | Admitting: Obstetrics and Gynecology

## 2020-02-01 NOTE — Telephone Encounter (Signed)
-----   Message from Malachy Mood, MD sent at 01/23/2020  7:47 PM EDT ----- Regarding: Surgery Surgery Booking Request Patient Full Name:  Sharon Zimmerman  MRN: [id number]  DOB: [birth year]  Surgeon: Malachy Mood, MD  Requested Surgery Date and Time: 2-4 weeks Primary Diagnosis AND Code: AUB N93.9  Secondary Diagnosis and Code:  Surgical Procedure: hysteroscopy, novasure endometrial ablation, mirena IUD removal RNFA Requested?: No L&D Notification: No Admission Status: same day surgery Length of Surgery: 50 min Special Case Needs: No H&P: Done 01/23/2020 Phone Interview???:  No Interpreter: No Medical Clearance:  No Special Scheduling Instructions: No Any known health/anesthesia issues, diabetes, sleep apnea, latex allergy, defibrillator/pacemaker?: No Acuity: P2   (P1 highest, P2 delay may cause harm, P3 low, elective gyn, P4 lowest)

## 2020-02-08 ENCOUNTER — Ambulatory Visit: Payer: BC Managed Care – PPO | Admitting: Obstetrics and Gynecology

## 2020-02-13 ENCOUNTER — Encounter
Admission: RE | Admit: 2020-02-13 | Discharge: 2020-02-13 | Disposition: A | Discharge status: Home / Self Care | Payer: BC Managed Care – PPO | Source: Ambulatory Visit | Attending: Obstetrics and Gynecology | Admitting: Obstetrics and Gynecology

## 2020-02-13 ENCOUNTER — Other Ambulatory Visit: Payer: Self-pay

## 2020-02-13 HISTORY — DX: Idiopathic hypersomnia with long sleep time: G47.11

## 2020-02-13 HISTORY — DX: Gastro-esophageal reflux disease without esophagitis: K21.9

## 2020-02-13 HISTORY — DX: Depression, unspecified: F32.A

## 2020-02-13 HISTORY — DX: Personal history of other diseases of the digestive system: Z87.19

## 2020-02-13 NOTE — Patient Instructions (Signed)
COVID TESTING Date: February 19, 2020 Monday  Testing site:  [facility] [address] Thru Hours:  0:86 am - 1:00 pm Once you are tested, you are asked to stay quarantined (avoiding public places) until after your surgery.   Your procedure is scheduled on: February 20, 2020 Report to Day Surgery on the 2nd floor of the [facility]. To find out your arrival time, please call [phone] between 1PM - 3PM on: February 19, 2020  REMEMBER: Instructions that are not followed completely may result in serious medical risk, up to and including death; or upon the discretion of your surgeon and anesthesiologist your surgery may need to be rescheduled.  Do not eat food after midnight the night before surgery.  No gum chewing, lozengers or hard candies.  You may however, drink CLEAR liquids up to 2 hours before you are scheduled to arrive for your surgery. Do not drink anything within 2 hours of your scheduled arrival time.  Clear liquids include: - water  - apple juice without pulp - gatorade (not RED) - black coffee or tea (Do NOT add milk or creamers to the coffee or tea) Do NOT drink anything that is not on this list.  Type 1 and Type 2 diabetics should only drink water.  TAKE THESE MEDICATIONS THE MORNING OF SURGERY WITH A SIP OF WATER: METOPROLOL PANTOPRAZOLE (take one the night before and one on the morning of surgery - helps to prevent nausea after surgery.)  Stop Anti-inflammatories (NSAIDS) such as Advil, Aleve, Ibuprofen, Motrin, Naproxen, Naprosyn and ASPIRIN OR Aspirin based products such as Excedrin, Goodys Powder, BC Powder. (May take Tylenol or Acetaminophen if needed.)  Stop ANY OVER THE COUNTER supplements until after surgery. (May continue Vitamin D, Vitamin B, and multivitamin.)  No Alcohol for 24 hours before or after surgery.  No Smoking including e-cigarettes for 24 hours prior to surgery.  No chewable tobacco products for at least 6  hours prior to surgery.  No nicotine patches on the day of surgery.  Do not use any "recreational" drugs for at least a week prior to your surgery.  Please be advised that the combination of cocaine and anesthesia may have negative outcomes, up to and including death. If you test positive for cocaine, your surgery will be cancelled.  On the morning of surgery brush your teeth with toothpaste and water, you may rinse your mouth with mouthwash if you wish. Do not swallow any toothpaste or mouthwash.  Do not wear jewelry, make-up, hairpins, clips or nail polish.  Do not wear lotions, powders, or perfumes.   Do not shave 48 hours prior to surgery.   Contact lenses, hearing aids and dentures may not be worn into surgery.  Do not bring valuables to the hospital. [facility] is not responsible for any missing/lost belongings or valuables.   Use CHG Soap as directed on instruction sheet.  Bring your C-PAP to the hospital with you in case you may have to spend the night.   Notify your doctor if there is any change in your medical condition (cold, fever, infection).  Wear comfortable clothing (specific to your surgery type) to the hospital.  Plan for stool softeners for home use; pain medications have a tendency to cause constipation. You can also help prevent constipation by eating foods high in fiber such as fruits and vegetables and drinking plenty of fluids as your diet allows.  After surgery, you can help prevent lung complications by  doing breathing exercises.  Take deep breaths and cough every 1-2 hours. Your doctor may order a device called an Incentive Spirometer to help you take deep breaths. When coughing or sneezing, hold a pillow firmly against your incision with both hands. This is called "splinting." Doing this helps protect your incision. It also decreases belly discomfort.   If you are being discharged the day of surgery, you will not be allowed to drive home. You will  need a responsible adult (18 years or older) to drive you home and stay with you that night.   If you are taking public transportation, you will need to have a responsible adult (18 years or older) with you. Please confirm with your physician that it is acceptable to use public transportation.   Please call the [place] [facility] at [phone] if you have any questions about these instructions.  Visitation Policy:  Patients undergoing a surgery or procedure may have one family member or support person with them as long as that person is not COVID-19 positive or experiencing its symptoms.  That person may remain in the waiting area during the procedure.  Children under 27 years of age may have both parents or legal guardians with them during their procedure.  Inpatient Visitation Update:   In an effort to ensure the safety of our team members and our patients, we are implementing a change to our visitation policy:  Effective Monday, Aug. 9, at 7 a.m., inpatients will be allowed one support person.  o The support person may change daily.  o The support person must pass our screening, gel in and out, and wear a mask at all times, including in the patient's room.  o Patients must also wear a mask when staff or their support person are in the room.  o Masking is required regardless of vaccination status.  Systemwide, no visitors 17 or younger.

## 2020-02-19 ENCOUNTER — Other Ambulatory Visit
Admission: RE | Admit: 2020-02-19 | Discharge: 2020-02-19 | Disposition: A | Discharge status: Home / Self Care | Payer: BC Managed Care – PPO | Source: Ambulatory Visit | Attending: Obstetrics and Gynecology | Admitting: Obstetrics and Gynecology

## 2020-02-19 ENCOUNTER — Encounter: Payer: Self-pay | Admitting: Urgent Care

## 2020-02-19 ENCOUNTER — Other Ambulatory Visit: Payer: Self-pay

## 2020-02-19 DIAGNOSIS — I10 Essential (primary) hypertension: Secondary | ICD-10-CM | POA: Diagnosis not present

## 2020-02-19 DIAGNOSIS — Z20822 Contact with and (suspected) exposure to covid-19: Secondary | ICD-10-CM | POA: Insufficient documentation

## 2020-02-19 DIAGNOSIS — N920 Excessive and frequent menstruation with regular cycle: Secondary | ICD-10-CM | POA: Diagnosis not present

## 2020-02-19 DIAGNOSIS — K219 Gastro-esophageal reflux disease without esophagitis: Secondary | ICD-10-CM | POA: Diagnosis not present

## 2020-02-19 DIAGNOSIS — Z30432 Encounter for removal of intrauterine contraceptive device: Secondary | ICD-10-CM | POA: Diagnosis not present

## 2020-02-19 DIAGNOSIS — Z79899 Other long term (current) drug therapy: Secondary | ICD-10-CM | POA: Diagnosis not present

## 2020-02-19 DIAGNOSIS — Z888 Allergy status to other drugs, medicaments and biological substances status: Secondary | ICD-10-CM | POA: Diagnosis not present

## 2020-02-19 DIAGNOSIS — Z6841 Body Mass Index (BMI) 40.0 and over, adult: Secondary | ICD-10-CM | POA: Diagnosis not present

## 2020-02-19 DIAGNOSIS — G4711 Idiopathic hypersomnia with long sleep time: Secondary | ICD-10-CM | POA: Diagnosis not present

## 2020-02-19 DIAGNOSIS — F329 Major depressive disorder, single episode, unspecified: Secondary | ICD-10-CM | POA: Diagnosis not present

## 2020-02-19 DIAGNOSIS — Z9884 Bariatric surgery status: Secondary | ICD-10-CM | POA: Diagnosis not present

## 2020-02-19 DIAGNOSIS — Z01818 Encounter for other preprocedural examination: Secondary | ICD-10-CM | POA: Insufficient documentation

## 2020-02-19 DIAGNOSIS — G4730 Sleep apnea, unspecified: Secondary | ICD-10-CM | POA: Diagnosis not present

## 2020-02-19 LAB — POTASSIUM: Potassium: 3.1 mmol/L — ABNORMAL LOW (ref 3.5–5.1)

## 2020-02-19 LAB — SARS CORONAVIRUS 2 (TAT 6-24 HRS): SARS Coronavirus 2: NEGATIVE

## 2020-02-19 NOTE — Progress Notes (Signed)
  [facility] Perioperative Services: Pre-Admission/Anesthesia Testing  Abnormal Lab Notification    Date: 02/19/20  Name: Dymin Jay-Fuller MRN:   [id number]  Re: Abnormal labs noted during PAT appointment   Provider(s) Notified: Malachy Mood, MD Notification mode: Routed and/or faxed via [facility]   ABNORMAL LAB VALUE(S): Lab Results  Component Value Date   K 3.1 (L) 02/19/2020    Notes: Patient is on daily thiazide diuretic (HCTZ) therapy for her HTN. She is scheduled for IUD removal, D&C, and hysteroscopy with novasure ablation on 02/20/2020. Forwarded to surgeon for optimization prior to procedure. Will have SDS staff recheck K+ level prior to surgery; order entered. This is a Community education officer; no formal response is required.  Honor Loh, MSN, APRN, FNP-C, CEN [facility]  Peri-operative Services Nurse Practitioner Phone: [phone] 02/19/20 12:51 PM

## 2020-02-20 ENCOUNTER — Ambulatory Visit: Payer: BC Managed Care – PPO | Admitting: Certified Registered Nurse Anesthetist

## 2020-02-20 ENCOUNTER — Encounter
Admission: RE | Disposition: A | Discharge status: Home / Self Care | Payer: Self-pay | Source: Ambulatory Visit | Attending: Obstetrics and Gynecology

## 2020-02-20 ENCOUNTER — Other Ambulatory Visit: Payer: Self-pay

## 2020-02-20 ENCOUNTER — Encounter: Payer: Self-pay | Admitting: Obstetrics and Gynecology

## 2020-02-20 ENCOUNTER — Ambulatory Visit
Admission: RE | Admit: 2020-02-20 | Discharge: 2020-02-20 | Disposition: A | Discharge status: Home / Self Care | Payer: BC Managed Care – PPO | Source: Ambulatory Visit | Attending: Obstetrics and Gynecology | Admitting: Obstetrics and Gynecology

## 2020-02-20 DIAGNOSIS — G4711 Idiopathic hypersomnia with long sleep time: Secondary | ICD-10-CM | POA: Diagnosis not present

## 2020-02-20 DIAGNOSIS — K219 Gastro-esophageal reflux disease without esophagitis: Secondary | ICD-10-CM | POA: Diagnosis not present

## 2020-02-20 DIAGNOSIS — Z30432 Encounter for removal of intrauterine contraceptive device: Secondary | ICD-10-CM | POA: Diagnosis not present

## 2020-02-20 DIAGNOSIS — Z6841 Body Mass Index (BMI) 40.0 and over, adult: Secondary | ICD-10-CM | POA: Insufficient documentation

## 2020-02-20 DIAGNOSIS — Z888 Allergy status to other drugs, medicaments and biological substances status: Secondary | ICD-10-CM | POA: Diagnosis not present

## 2020-02-20 DIAGNOSIS — I10 Essential (primary) hypertension: Secondary | ICD-10-CM | POA: Diagnosis not present

## 2020-02-20 DIAGNOSIS — Z9884 Bariatric surgery status: Secondary | ICD-10-CM | POA: Diagnosis not present

## 2020-02-20 DIAGNOSIS — Z79899 Other long term (current) drug therapy: Secondary | ICD-10-CM | POA: Insufficient documentation

## 2020-02-20 DIAGNOSIS — F329 Major depressive disorder, single episode, unspecified: Secondary | ICD-10-CM | POA: Diagnosis not present

## 2020-02-20 DIAGNOSIS — N920 Excessive and frequent menstruation with regular cycle: Secondary | ICD-10-CM | POA: Diagnosis not present

## 2020-02-20 DIAGNOSIS — Z20822 Contact with and (suspected) exposure to covid-19: Secondary | ICD-10-CM | POA: Insufficient documentation

## 2020-02-20 DIAGNOSIS — N939 Abnormal uterine and vaginal bleeding, unspecified: Secondary | ICD-10-CM | POA: Diagnosis not present

## 2020-02-20 DIAGNOSIS — G4730 Sleep apnea, unspecified: Secondary | ICD-10-CM | POA: Insufficient documentation

## 2020-02-20 HISTORY — PX: DILITATION & CURRETTAGE/HYSTROSCOPY WITH NOVASURE ABLATION: SHX5568

## 2020-02-20 HISTORY — PX: IUD REMOVAL: SHX5392

## 2020-02-20 LAB — POCT I-STAT, CHEM 8
BUN: 7 mg/dL (ref 6–20)
Calcium, Ion: 1.1 mmol/L — ABNORMAL LOW (ref 1.15–1.40)
Chloride: 106 mmol/L (ref 98–111)
Creatinine, Ser: 0.6 mg/dL (ref 0.44–1.00)
Glucose, Bld: 93 mg/dL (ref 70–99)
HCT: 32 % — ABNORMAL LOW (ref 36.0–46.0)
Hemoglobin: 10.9 g/dL — ABNORMAL LOW (ref 12.0–15.0)
Potassium: 3.1 mmol/L — ABNORMAL LOW (ref 3.5–5.1)
Sodium: 143 mmol/L (ref 135–145)
TCO2: 22 mmol/L (ref 22–32)

## 2020-02-20 LAB — POCT PREGNANCY, URINE: Preg Test, Ur: NEGATIVE

## 2020-02-20 SURGERY — DILATATION & CURETTAGE/HYSTEROSCOPY WITH NOVASURE ABLATION
Anesthesia: General

## 2020-02-20 MED ORDER — PHENYLEPHRINE HCL (PRESSORS) 10 MG/ML IV SOLN
INTRAVENOUS | Status: AC
  Filled 2020-02-20: qty 1

## 2020-02-20 MED ORDER — IBUPROFEN 600 MG PO TABS
ORAL_TABLET | ORAL | Status: AC
  Filled 2020-02-20: qty 1

## 2020-02-20 MED ORDER — CHLORHEXIDINE GLUCONATE 0.12 % MT SOLN: 15.0000 mL | Freq: Once | OROMUCOSAL | Status: AC

## 2020-02-20 MED ORDER — FENTANYL CITRATE (PF) 100 MCG/2ML IJ SOLN
INTRAMUSCULAR | Status: AC
  Filled 2020-02-20: qty 2

## 2020-02-20 MED ORDER — FERRIC SUBSULFATE 259 MG/GM EX SOLN
CUTANEOUS | Status: AC
  Filled 2020-02-20: qty 8

## 2020-02-20 MED ORDER — ORAL CARE MOUTH RINSE: 15.0000 mL | Freq: Once | OROMUCOSAL | Status: AC

## 2020-02-20 MED ORDER — MIDAZOLAM HCL 2 MG/2ML IJ SOLN
INTRAMUSCULAR | Status: AC
  Filled 2020-02-20: qty 2

## 2020-02-20 MED ORDER — DEXAMETHASONE SODIUM PHOSPHATE 10 MG/ML IJ SOLN
INTRAMUSCULAR | Status: AC
  Filled 2020-02-20: qty 1

## 2020-02-20 MED ORDER — ROCURONIUM BROMIDE 100 MG/10ML IV SOLN
INTRAVENOUS | Status: DC | PRN
  Administered 2020-02-20: 10 mg via INTRAVENOUS

## 2020-02-20 MED ORDER — GLYCOPYRROLATE 0.2 MG/ML IJ SOLN
INTRAMUSCULAR | Status: AC
  Filled 2020-02-20: qty 1

## 2020-02-20 MED ORDER — IBUPROFEN 600 MG PO TABS
600.0000 mg | ORAL_TABLET | Freq: Four times a day (QID) | ORAL | 0 refills | Status: DC | PRN
Start: 2020-02-20 — End: 2021-01-03

## 2020-02-20 MED ORDER — GLYCOPYRROLATE 0.2 MG/ML IJ SOLN
INTRAMUSCULAR | Status: DC | PRN
  Administered 2020-02-20: .1 mg via INTRAVENOUS

## 2020-02-20 MED ORDER — SEVOFLURANE IN SOLN
RESPIRATORY_TRACT | Status: AC
  Filled 2020-02-20: qty 250

## 2020-02-20 MED ORDER — ACETAMINOPHEN 10 MG/ML IV SOLN
INTRAVENOUS | Status: AC
  Filled 2020-02-20: qty 100

## 2020-02-20 MED ORDER — LIDOCAINE HCL (CARDIAC) PF 100 MG/5ML IV SOSY
PREFILLED_SYRINGE | INTRAVENOUS | Status: DC | PRN
  Administered 2020-02-20: 100 mg via INTRAVENOUS

## 2020-02-20 MED ORDER — SUCCINYLCHOLINE CHLORIDE 20 MG/ML IJ SOLN
INTRAMUSCULAR | Status: DC | PRN
  Administered 2020-02-20: 140 mg via INTRAVENOUS

## 2020-02-20 MED ORDER — ONDANSETRON HCL 4 MG/2ML IJ SOLN
INTRAMUSCULAR | Status: AC
  Filled 2020-02-20: qty 2

## 2020-02-20 MED ORDER — PROPOFOL 10 MG/ML IV BOLUS
INTRAVENOUS | Status: AC
  Filled 2020-02-20: qty 20

## 2020-02-20 MED ORDER — SUGAMMADEX SODIUM 200 MG/2ML IV SOLN
INTRAVENOUS | Status: DC | PRN
  Administered 2020-02-20: 200 mg via INTRAVENOUS

## 2020-02-20 MED ORDER — LACTATED RINGERS IV SOLN: INTRAVENOUS | Status: DC

## 2020-02-20 MED ORDER — OXYCODONE HCL 5 MG PO TABS
ORAL_TABLET | ORAL | Status: AC
  Filled 2020-02-20: qty 1

## 2020-02-20 MED ORDER — FENTANYL CITRATE (PF) 100 MCG/2ML IJ SOLN
INTRAMUSCULAR | Status: DC | PRN
  Administered 2020-02-20: 25 ug via INTRAVENOUS
  Administered 2020-02-20: 50 ug via INTRAVENOUS
  Administered 2020-02-20: 25 ug via INTRAVENOUS

## 2020-02-20 MED ORDER — ACETAMINOPHEN 10 MG/ML IV SOLN
INTRAVENOUS | Status: DC | PRN
  Administered 2020-02-20: 1000 mg via INTRAVENOUS

## 2020-02-20 MED ORDER — DEXAMETHASONE SODIUM PHOSPHATE 10 MG/ML IJ SOLN
INTRAMUSCULAR | Status: DC | PRN
  Administered 2020-02-20: 10 mg via INTRAVENOUS

## 2020-02-20 MED ORDER — ONDANSETRON HCL 4 MG/2ML IJ SOLN: 4.0000 mg | Freq: Once | INTRAMUSCULAR | Status: DC | PRN

## 2020-02-20 MED ORDER — ONDANSETRON HCL 4 MG/2ML IJ SOLN
INTRAMUSCULAR | Status: DC | PRN
  Administered 2020-02-20: 4 mg via INTRAVENOUS

## 2020-02-20 MED ORDER — CHLORHEXIDINE GLUCONATE 0.12 % MT SOLN
OROMUCOSAL | Status: AC
  Administered 2020-02-20: 15 mL via OROMUCOSAL
  Filled 2020-02-20: qty 15

## 2020-02-20 MED ORDER — OXYCODONE HCL 5 MG/5ML PO SOLN: 5.0000 mg | Freq: Once | ORAL | Status: AC | PRN

## 2020-02-20 MED ORDER — ACETAMINOPHEN 10 MG/ML IV SOLN: 1000.0000 mg | Freq: Once | INTRAVENOUS | Status: DC | PRN

## 2020-02-20 MED ORDER — LIDOCAINE HCL (PF) 2 % IJ SOLN
INTRAMUSCULAR | Status: AC
  Filled 2020-02-20: qty 5

## 2020-02-20 MED ORDER — FENTANYL CITRATE (PF) 100 MCG/2ML IJ SOLN: 25.0000 ug | INTRAMUSCULAR | Status: DC | PRN

## 2020-02-20 MED ORDER — HYDROCODONE-ACETAMINOPHEN 5-325 MG PO TABS: 1.0000 | ORAL_TABLET | Freq: Four times a day (QID) | ORAL | 0 refills | Status: DC | PRN

## 2020-02-20 MED ORDER — PHENYLEPHRINE HCL (PRESSORS) 10 MG/ML IV SOLN
INTRAVENOUS | Status: DC | PRN
  Administered 2020-02-20: 200 ug via INTRAVENOUS
  Administered 2020-02-20: 100 ug via INTRAVENOUS

## 2020-02-20 MED ORDER — IBUPROFEN 600 MG PO TABS
600.0000 mg | ORAL_TABLET | Freq: Four times a day (QID) | ORAL | Status: DC | PRN
  Administered 2020-02-20: 600 mg via ORAL
  Filled 2020-02-20 (×2): qty 1

## 2020-02-20 MED ORDER — OXYCODONE HCL 5 MG PO TABS
5.0000 mg | ORAL_TABLET | Freq: Once | ORAL | Status: AC | PRN
  Administered 2020-02-20: 5 mg via ORAL

## 2020-02-20 MED ORDER — PROPOFOL 10 MG/ML IV BOLUS
INTRAVENOUS | Status: DC | PRN
  Administered 2020-02-20: 170 mg via INTRAVENOUS
  Administered 2020-02-20: 20 mg via INTRAVENOUS

## 2020-02-20 MED ORDER — KETOROLAC TROMETHAMINE 30 MG/ML IJ SOLN
INTRAMUSCULAR | Status: DC | PRN
  Administered 2020-02-20: 30 mg via INTRAVENOUS

## 2020-02-20 SURGICAL SUPPLY — 17 items
ABLATOR SURESOUND NOVASURE (ABLATOR) ×2 IMPLANT
CATH ROBINSON RED A/P 16FR (CATHETERS) ×2 IMPLANT
COVER WAND RF STERILE (DRAPES) ×2 IMPLANT
GLOVE BIO SURGEON STRL SZ7 (GLOVE) ×2 IMPLANT
GLOVE INDICATOR 7.5 STRL GRN (GLOVE) ×2 IMPLANT
GOWN STRL REUS W/ TWL LRG LVL3 (GOWN DISPOSABLE) ×2 IMPLANT
GOWN STRL REUS W/TWL LRG LVL3 (GOWN DISPOSABLE) ×4
IV LACTATED RINGERS 1000ML (IV SOLUTION) ×2 IMPLANT
KIT PROCEDURE FLUENT (KITS) ×2 IMPLANT
KIT TURNOVER CYSTO (KITS) ×2 IMPLANT
NS IRRIG 500ML POUR BTL (IV SOLUTION) ×2 IMPLANT
PACK DNC HYST (MISCELLANEOUS) ×2 IMPLANT
PAD OB MATERNITY 4.3X12.25 (PERSONAL CARE ITEMS) ×2 IMPLANT
PAD PREP 24X41 OB/GYN DISP (PERSONAL CARE ITEMS) ×2 IMPLANT
SEAL ROD LENS SCOPE MYOSURE (ABLATOR) ×2 IMPLANT
TOWEL OR 17X26 4PK STRL BLUE (TOWEL DISPOSABLE) ×2 IMPLANT
TUBING CONNECTING 10 (TUBING) ×2 IMPLANT

## 2020-02-20 NOTE — Anesthesia Procedure Notes (Signed)
Procedure Name: Intubation Date/Time: 02/20/2020 7:38 AM Performed by: Johnna Acosta, CRNA Pre-anesthesia Checklist: Patient identified, Emergency Drugs available, Suction available, Patient being monitored and Timeout performed Patient Re-evaluated:Patient Re-evaluated prior to induction Oxygen Delivery Method: Circle system utilized Preoxygenation: Pre-oxygenation with 100% oxygen Induction Type: IV induction and Rapid sequence Ventilation: Mask ventilation without difficulty Laryngoscope Size: McGraph and 3 Grade View: Grade I Tube type: Oral Tube size: 7.0 mm Number of attempts: 1 Airway Equipment and Method: Stylet,  Video-laryngoscopy and Oral airway Placement Confirmation: ETT inserted through vocal cords under direct vision and positive ETCO2 Secured at: 23 cm Tube secured with: Tape Dental Injury: Teeth and Oropharynx as per pre-operative assessment  Difficulty Due To: Difficulty was anticipated, Difficult Airway- due to large tongue and Difficult Airway- due to dentition

## 2020-02-20 NOTE — Transfer of Care (Signed)
Immediate Anesthesia Transfer of Care Note  Patient: Sharon Zimmerman  Procedure(s) Performed: DILATATION & CURETTAGE/HYSTEROSCOPY WITH NOVASURE ABLATION (N/A ) MIRENA INTRAUTERINE DEVICE (IUD) REMOVAL (N/A )  Patient Location: PACU  Anesthesia Type:General  Level of Consciousness: awake and alert   Airway & Oxygen Therapy: Patient Spontanous Breathing and Patient connected to face mask oxygen  Post-op Assessment: Report given to RN and Post -op Vital signs reviewed and stable  Post vital signs: Reviewed and stable  Last Vitals:  Vitals Value Taken Time  BP 106/67 02/20/20 0830  Temp 36.2 C 02/20/20 0830  Pulse 79 02/20/20 0833  Resp 15 02/20/20 0833  SpO2 100 % 02/20/20 0833  Vitals shown include unvalidated device data.  Last Pain:  Vitals:   02/20/20 0830  TempSrc:   PainSc: 0-No pain         Complications: No complications documented.

## 2020-02-20 NOTE — Op Note (Signed)
Preoperative Diagnosis: 1) 44 y.o. with menorrhagia  Postoperative Diagnosis: 1) 44 y.o. with menorrhagia  Operation Performed: Hysteroscopy, Mirena IUD removal, novasure endometrial ablation  Indication: The patient was previously counseled on the overall effectiveness of the device in achieving amenorrhea.  She has personal tried and failed Mirena IUD.  She is aware that some patient may continue to have menstrual cycles although these are generally greatly reduced in flow.  In addition she was quoted a failure rate for endometrial ablation of approximately 25% within the frist 4 years, but these failures may happen at any time during or after the initial 4 year postop period.  She is aware that pregnancy is contra-indicated in the setting of prior endometrial ablation, and that ablation itself does not confer and contraceptive benefits and that she will need to continue to rely on some means of contraception following the procedure.  Although rare and generally confined to patient who have undergone prior tubal ligatoin, post-ablation tubal sterilizaton syndrome (PATSS) may also occur.   Surgeon: Malachy Mood, MD  Anesthesia: Choice  Preoperative Antibiotics: none  Estimated Blood Loss: 20 mL  IV Fluids: 466mL  Urine Output:: 147mL  Drains or Tubes: none  Implants: none  Specimens Removed: None  Complications: none  Intraoperative Findings: Normal appearing vagina mucosa and cervix.  Normal uterine cavity.  Good coverage of the uterine cavity on post-tablation hysteroscopy with ablation of the uterine cornua and sparing of the endocervix.   Patient Condition: stable   PROCEDURE IN DETAIL: After informed consent was obtained, the patient was taken to the operating room where anesthesia was obtained without difficulty. The patient was positioned in the dorsal lithotomy position using Allen stirrups. The patient's bladder was decompressed using a red rubber catheter. The  patient was examined under anesthesia, with the above noted findings.  An operative speculum was placed inside the patient's vagina, the cervix was adequately visualized, and the the anterior lip of the cervix was grasped with a single tooth tenaculum. A ring forceps was used to grasp the IUD string and removed it.  The uterine cavity was sounded to 9.5 cm, and then the cervix was progressively dilated to  71mm using Pratt dilators. The 0 degree hysteroscope was introduced through the cervix and advanced under direct visualization in the uterine cavity.  Normal saline was used as the distending fluid during the hysteroscopy.  This revealed the above noted intracavitary findings. The cervical length was obtained by retracting the hysteroscope to the level of the internal cervical os and marking that point on the hysteroscopy.  This measurement yielded a value of 4.5cm, yielding a total cavity length of 5cm based on the previously obtained sounding measurement  The NovaSure device was then placed without difficulty. The Novasure array was deployed and seated, with a resulting uterine width of 4.4cm. The NovaSure device passed the cavity assessment test.  Following this the device was activaed at a power of with a total ablation time of 57 seconds at a power of 121. The NovaSure device was removed and repeat hysteroscopy reveals an appropriate lining of the uterus and no perforation or injury. Hysteroscope is removed with minimal discrepancy of fluid.   Tenaculum was removed with excellent hemostasis noted after application of silver nitrate to the tenaculum entry sites. She was then taken out of dorsal lithotomy. Hemostasis noted.  The patient tolerated the procedure well. Sponge, lap and needle counts were correct times two. The patient was taken to recovery room in excellent condition.

## 2020-02-20 NOTE — Anesthesia Preprocedure Evaluation (Signed)
Anesthesia Evaluation  Patient identified by MRN, date of birth, ID bandGeneral Assessment Comment:Patient somnolent, easily arousable and becomes alert  Reviewed: Allergy & Precautions, NPO status , Patient's Chart, lab work & pertinent test results  History of Anesthesia Complications Negative for: history of anesthetic complications  Airway Mallampati: II  TM Distance: >3 FB Neck ROM: Full    Dental  (+) Chipped, Dental Advisory Given   Pulmonary sleep apnea and Continuous Positive Airway Pressure Ventilation , neg COPD, Patient abstained from smoking.Not current smoker,    Pulmonary exam normal breath sounds clear to auscultation       Cardiovascular Exercise Tolerance: Good METShypertension, (-) CAD and (-) Past MI (-) dysrhythmias  Rhythm:Regular Rate:Normal - Systolic murmurs    Neuro/Psych PSYCHIATRIC DISORDERS Depression negative neurological ROS     GI/Hepatic hiatal hernia, GERD  Medicated and Controlled,(+)     (-) substance abuse  ,   Endo/Other  neg diabetesMorbid obesity  Renal/GU negative Renal ROS     Musculoskeletal   Abdominal (+) + obese,   Peds  Hematology   Anesthesia Other Findings Past Medical History: No date: Depression No date: GERD (gastroesophageal reflux disease) No date: History of hiatal hernia No date: Hypertension 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  Reproductive/Obstetrics                             Anesthesia Physical Anesthesia Plan  ASA: III  Anesthesia Plan: General   Post-op Pain Management:    Induction: Intravenous and Rapid sequence  PONV Risk Score and Plan: 4 or greater and Ondansetron and Dexamethasone  Airway Management Planned: Oral ETT and Video Laryngoscope Planned  Additional Equipment: None  Intra-op Plan:   Post-operative Plan: Extubation in OR  Informed Consent: I have reviewed the patients History and Physical, chart,  labs and discussed the procedure including the risks, benefits and alternatives for the proposed anesthesia with the patient or authorized representative who has indicated his/her understanding and acceptance.     Dental advisory given  Plan Discussed with: CRNA and Surgeon  Anesthesia Plan Comments: (Discussed risks of anesthesia with patient, including PONV, sore throat, lip/dental damage. Rare risks discussed as well, such as cardiorespiratory and neurological sequelae. Patient understands. Patient informed about increased incidence of above perioperative risk due to high BMI. Patient understands. )        Anesthesia Quick Evaluation

## 2020-02-20 NOTE — Discharge Instructions (Addendum)
Dilation and Curettage or Vacuum Curettage, Care After These instructions give you information about caring for yourself after your procedure. Your doctor may also give you more specific instructions. Call your doctor if you have any problems or questions after your procedure. Follow these instructions at home: Activity  Do not drive or use heavy machinery while taking prescription pain medicine.  For 24 hours after your procedure, avoid driving.  Take short walks often, followed by rest periods. Ask your doctor what activities are safe for you. After one or two days, you may be able to return to your normal activities.  Do not lift anything that is heavier than 10 lb (4.5 kg) until your doctor approves.  For at least 2 weeks, or as long as told by your doctor: ? Do not douche. ? Do not use tampons. ? Do not have sex. General instructions   Take over-the-counter and prescription medicines only as told by your doctor. This is very important if you take blood thinning medicine.  Do not take baths, swim, or use a hot tub until your doctor approves. Take showers instead of baths.  Wear compression stockings as told by your doctor.  It is up to you to get the results of your procedure. Ask your doctor when your results will be ready.  Keep all follow-up visits as told by your doctor. This is important. Contact a doctor if:  You have very bad cramps that get worse or do not get better with medicine.  You have very bad pain in your belly (abdomen).  You cannot drink fluids without throwing up (vomiting).  You get pain in a different part of the area between your belly and thighs (pelvis).  You have bad-smelling discharge from your vagina.  You have a rash. Get help right away if:  You are bleeding a lot from your vagina. A lot of bleeding means soaking more than one sanitary pad in an hour, for 2 hours in a row.  You have clumps of blood (blood clots) coming from your  vagina.  You have a fever or chills.  Your belly feels very tender or hard.  You have chest pain.  You have trouble breathing.  You cough up blood.  You feel dizzy.  You feel light-headed.  You pass out (faint).  You have pain in your neck or shoulder area. Summary  Take short walks often, followed by rest periods. Ask your doctor what activities are safe for you. After one or two days, you may be able to return to your normal activities.  Do not lift anything that is heavier than 10 lb (4.5 kg) until your doctor approves.  Do not take baths, swim, or use a hot tub until your doctor approves. Take showers instead of baths.  Contact your doctor if you have any symptoms of infection, like bad-smelling discharge from your vagina. This information is not intended to replace advice given to you by your health care provider. Make sure you discuss any questions you have with your health care provider. Document Revised: 06/04/2017 Document Reviewed: 03/09/2016 Elsevier Patient Education  2020 [place]   1) The drugs that you were given will stay in your system until tomorrow so for the next 24 hours you should not:  A) Drive an automobile B) Make any legal decisions C) Drink any alcoholic beverage   2) You may resume regular meals tomorrow.  Today it is better to start with  liquids and gradually work up to solid foods.  You may eat anything you prefer, but it is better to start with liquids, then soup and crackers, and gradually work up to solid foods.   3) Please notify your doctor immediately if you have any unusual bleeding, trouble breathing, redness and pain at the surgery site, drainage, fever, or pain not relieved by medication.    4) Additional Instructions:        Please contact your physician with any problems or Same Day Surgery at [phone], Monday through Friday 6 am to 4 pm, or [place] at  [facility] number at [phone].

## 2020-02-20 NOTE — Interval H&P Note (Signed)
History and Physical Interval Note:  02/20/2020 7:23 AM  Sharon Zimmerman  has presented today for surgery, with the diagnosis of AUB N93.9.  The various methods of treatment have been discussed with the patient and family. After consideration of risks, benefits and other options for treatment, the patient has consented to  Procedure(s): DILATATION & CURETTAGE/HYSTEROSCOPY WITH NOVASURE ABLATION (N/A) Cobb (IUD) REMOVAL (N/A) as a surgical intervention.  The patient's history has been reviewed, patient examined, no change in status, stable for surgery.  I have reviewed the patient's chart and labs.  Questions were answered to the patient's satisfaction.     Malachy Mood

## 2020-02-21 ENCOUNTER — Encounter: Payer: Self-pay | Admitting: Obstetrics and Gynecology

## 2020-02-21 NOTE — Anesthesia Postprocedure Evaluation (Signed)
Anesthesia Post Note  Patient: Sharon Zimmerman  Procedure(s) Performed: DILATATION & CURETTAGE/HYSTEROSCOPY WITH NOVASURE ABLATION (N/A ) MIRENA INTRAUTERINE DEVICE (IUD) REMOVAL (N/A )  Patient location during evaluation: PACU Anesthesia Type: General Level of consciousness: awake and alert Pain management: pain level controlled Vital Signs Assessment: post-procedure vital signs reviewed and stable Respiratory status: spontaneous breathing, nonlabored ventilation, respiratory function stable and patient connected to nasal cannula oxygen Cardiovascular status: blood pressure returned to baseline and stable Postop Assessment: no apparent nausea or vomiting Anesthetic complications: no   No complications documented.   Last Vitals:  Vitals:   02/20/20 0924 02/20/20 0949  BP: 105/72 135/72  Pulse: 76 75  Resp: 18 18  Temp: (!) 36.3 C (!) 36.3 C  SpO2: 99% 100%    Last Pain:  Vitals:   02/20/20 0949  TempSrc: Temporal  PainSc: 3                  Arita Miss

## 2020-02-26 ENCOUNTER — Encounter (HOSPITAL_COMMUNITY): Payer: Self-pay

## 2020-02-26 ENCOUNTER — Ambulatory Visit (INDEPENDENT_AMBULATORY_CARE_PROVIDER_SITE_OTHER): Payer: BC Managed Care – PPO | Admitting: Obstetrics and Gynecology

## 2020-02-26 ENCOUNTER — Encounter (HOSPITAL_COMMUNITY)
Admission: RE | Admit: 2020-02-26 | Discharge: 2020-02-26 | Disposition: A | Discharge status: Home / Self Care | Payer: BC Managed Care – PPO | Source: Ambulatory Visit | Attending: Family Medicine | Admitting: Family Medicine

## 2020-02-26 ENCOUNTER — Encounter: Payer: Self-pay | Admitting: Obstetrics and Gynecology

## 2020-02-26 ENCOUNTER — Other Ambulatory Visit: Payer: Self-pay

## 2020-02-26 VITALS — BP 142/82 | HR 96 | Wt 278.0 lb

## 2020-02-26 DIAGNOSIS — Z4889 Encounter for other specified surgical aftercare: Secondary | ICD-10-CM

## 2020-02-26 DIAGNOSIS — D509 Iron deficiency anemia, unspecified: Secondary | ICD-10-CM | POA: Insufficient documentation

## 2020-02-26 DIAGNOSIS — Z9884 Bariatric surgery status: Secondary | ICD-10-CM | POA: Insufficient documentation

## 2020-02-26 MED ORDER — SODIUM CHLORIDE 0.9 % IV SOLN
510.0000 mg | Freq: Once | INTRAVENOUS | Status: AC
  Administered 2020-02-26: 510 mg via INTRAVENOUS
  Filled 2020-02-26: qty 510

## 2020-02-26 MED ORDER — SODIUM CHLORIDE 0.9 % IV SOLN: Freq: Once | INTRAVENOUS | Status: AC

## 2020-02-26 NOTE — Progress Notes (Signed)
      Postoperative Follow-up Patient presents post op from novasure 1weeks ago for abnormal uterine bleeding.  Subjective: Patient reports marked improvement in her preop symptoms. Eating a regular diet without difficulty. Pain is controlled without any medications.  Activity: normal activities of daily living.  Objective: Blood pressure (!) 142/82, pulse 96, weight 278 lb (126.1 kg).  General: NAD Pulmonary: no increased work of breathing Extremities: no edema Neurologic: normal gait    Admission on 02/20/2020, Discharged on 02/20/2020  Component Date Value Ref Range Status  . Preg Test, Ur 02/20/2020 NEGATIVE  NEGATIVE Final   Comment:        THE SENSITIVITY OF THIS METHODOLOGY IS >24 mIU/mL   . Sodium 02/20/2020 143  135 - 145 mmol/L Final  . Potassium 02/20/2020 3.1* 3.5 - 5.1 mmol/L Final  . Chloride 02/20/2020 106  98 - 111 mmol/L Final  . BUN 02/20/2020 7  6 - 20 mg/dL Final  . Creatinine, Ser 02/20/2020 0.60  0.44 - 1.00 mg/dL Final  . Glucose, Bld 02/20/2020 93  70 - 99 mg/dL Final   Glucose reference range applies only to samples taken after fasting for at least 8 hours.  . Calcium, Ion 02/20/2020 1.10* 1.15 - 1.40 mmol/L Final  . TCO2 02/20/2020 22  22 - 32 mmol/L Final  . Hemoglobin 02/20/2020 10.9* 12.0 - 15.0 g/dL Final  . HCT 02/20/2020 32.0* 36 - 46 % Final    Assessment: 44 y.o. s/p novasure, hysteroscopy, mirena IUD stable  Plan: Patient has done well after surgery with no apparent complications.  I have discussed the post-operative course to date, and the expected progress moving forward.  The patient understands what complications to be concerned about.  I will see the patient in routine follow up, or sooner if needed.    Activity plan: No restriction.   Malachy Mood, MD, [facility], [facility] 02/26/2020, 4:45 PM

## 2020-03-05 ENCOUNTER — Encounter: Payer: Self-pay | Admitting: Obstetrics and Gynecology

## 2020-03-05 ENCOUNTER — Other Ambulatory Visit: Payer: Self-pay

## 2020-03-05 ENCOUNTER — Encounter (HOSPITAL_COMMUNITY)
Admission: RE | Admit: 2020-03-05 | Discharge: 2020-03-05 | Disposition: A | Discharge status: Home / Self Care | Payer: BC Managed Care – PPO | Source: Ambulatory Visit | Attending: Family Medicine | Admitting: Family Medicine

## 2020-03-05 DIAGNOSIS — D509 Iron deficiency anemia, unspecified: Secondary | ICD-10-CM | POA: Diagnosis not present

## 2020-03-05 HISTORY — DX: Anemia, unspecified: D64.9

## 2020-03-05 MED ORDER — SODIUM CHLORIDE 0.9 % IV SOLN: Freq: Once | INTRAVENOUS | Status: AC

## 2020-03-05 MED ORDER — SODIUM CHLORIDE 0.9 % IV SOLN
510.0000 mg | Freq: Once | INTRAVENOUS | Status: AC
  Administered 2020-03-05: 510 mg via INTRAVENOUS
  Filled 2020-03-05: qty 510

## 2020-03-06 DIAGNOSIS — F5089 Other specified eating disorder: Secondary | ICD-10-CM | POA: Insufficient documentation

## 2020-04-01 ENCOUNTER — Ambulatory Visit (INDEPENDENT_AMBULATORY_CARE_PROVIDER_SITE_OTHER): Payer: BC Managed Care – PPO | Admitting: Obstetrics and Gynecology

## 2020-04-01 ENCOUNTER — Other Ambulatory Visit: Payer: Self-pay

## 2020-04-01 ENCOUNTER — Encounter: Payer: Self-pay | Admitting: Obstetrics and Gynecology

## 2020-04-01 VITALS — BP 116/88 | HR 75 | Wt 281.0 lb

## 2020-04-01 DIAGNOSIS — Z4889 Encounter for other specified surgical aftercare: Secondary | ICD-10-CM

## 2020-04-01 DIAGNOSIS — R300 Dysuria: Secondary | ICD-10-CM

## 2020-04-01 DIAGNOSIS — R350 Frequency of micturition: Secondary | ICD-10-CM

## 2020-04-01 MED ORDER — NITROFURANTOIN MONOHYD MACRO 100 MG PO CAPS
100.0000 mg | ORAL_CAPSULE | Freq: Two times a day (BID) | ORAL | 0 refills | Status: AC
Start: 2020-04-01 — End: 2020-04-06

## 2020-04-01 NOTE — Progress Notes (Signed)
      Postoperative Follow-up Patient presents post op from Novasure endometrial abaltion 1weeks ago for abnormal uterine bleeding.  Subjective: Patient reports marked improvement in her preop symptoms. Eating a regular diet without difficulty. Pain is controlled without any medications.  Activity: normal activities of daily living.  Objective: Blood pressure 116/88, pulse 75, weight 281 lb (127.5 kg).  General: NAD Pulmonary: no increased work of breathing GU: normal external female genitalia normal cervix, no CMT, uterus normal in shape and contour, no adnexal tenderness or masses Extremities: no edema Neurologic: normal gait    Admission on 02/20/2020, Discharged on 02/20/2020  Component Date Value Ref Range Status  . Preg Test, Ur 02/20/2020 NEGATIVE  NEGATIVE Final   Comment:        THE SENSITIVITY OF THIS METHODOLOGY IS >24 mIU/mL   . Sodium 02/20/2020 143  135 - 145 mmol/L Final  . Potassium 02/20/2020 3.1* 3.5 - 5.1 mmol/L Final  . Chloride 02/20/2020 106  98 - 111 mmol/L Final  . BUN 02/20/2020 7  6 - 20 mg/dL Final  . Creatinine, Ser 02/20/2020 0.60  0.44 - 1.00 mg/dL Final  . Glucose, Bld 02/20/2020 93  70 - 99 mg/dL Final   Glucose reference range applies only to samples taken after fasting for at least 8 hours.  . Calcium, Ion 02/20/2020 1.10* 1.15 - 1.40 mmol/L Final  . TCO2 02/20/2020 22  22 - 32 mmol/L Final  . Hemoglobin 02/20/2020 10.9* 12.0 - 15.0 g/dL Final  . HCT 02/20/2020 32.0* 36 - 46 % Final    Assessment: 44 y.o. s/p Novasure endometrial ablation stable  Plan: Patient has done well after surgery with no apparent complications.  I have discussed the post-operative course to date, and the expected progress moving forward.  The patient understands what complications to be concerned about.  I will see the patient in routine follow up, or sooner if needed.    Activity plan: No restriction.  Dysuria/frequency - Rx macrobid urine culture  sent   Malachy Mood, MD, [place], [facility] 04/01/2020, 10:55 AM

## 2020-04-03 LAB — URINE CULTURE

## 2021-01-03 ENCOUNTER — Ambulatory Visit (INDEPENDENT_AMBULATORY_CARE_PROVIDER_SITE_OTHER): Payer: 59 | Admitting: Obstetrics and Gynecology

## 2021-01-03 ENCOUNTER — Other Ambulatory Visit: Payer: Self-pay

## 2021-01-03 ENCOUNTER — Encounter: Payer: Self-pay | Admitting: Obstetrics and Gynecology

## 2021-01-03 ENCOUNTER — Other Ambulatory Visit (HOSPITAL_COMMUNITY)
Admission: RE | Admit: 2021-01-03 | Discharge: 2021-01-03 | Disposition: A | Discharge status: Home / Self Care | Payer: BC Managed Care – PPO | Source: Ambulatory Visit | Attending: Obstetrics and Gynecology | Admitting: Obstetrics and Gynecology

## 2021-01-03 VITALS — BP 144/90 | HR 94 | Ht 67.5 in | Wt 288.0 lb

## 2021-01-03 DIAGNOSIS — R319 Hematuria, unspecified: Secondary | ICD-10-CM

## 2021-01-03 DIAGNOSIS — M5441 Lumbago with sciatica, right side: Secondary | ICD-10-CM

## 2021-01-03 DIAGNOSIS — G8929 Other chronic pain: Secondary | ICD-10-CM

## 2021-01-03 DIAGNOSIS — N870 Mild cervical dysplasia: Secondary | ICD-10-CM

## 2021-01-03 DIAGNOSIS — M5442 Lumbago with sciatica, left side: Secondary | ICD-10-CM

## 2021-01-03 DIAGNOSIS — R1030 Lower abdominal pain, unspecified: Secondary | ICD-10-CM | POA: Diagnosis not present

## 2021-01-03 LAB — POCT URINALYSIS DIPSTICK
Appearance: NORMAL
Bilirubin, UA: NEGATIVE
Blood, UA: NEGATIVE
Glucose, UA: NEGATIVE
Ketones, UA: NEGATIVE
Leukocytes, UA: NEGATIVE
Nitrite, UA: NEGATIVE
Odor: NORMAL
Protein, UA: NEGATIVE
Spec Grav, UA: 1.015 (ref 1.010–1.025)
Urobilinogen, UA: 0.2 E.U./dL
pH, UA: 6 (ref 5.0–8.0)

## 2021-01-03 NOTE — Progress Notes (Signed)
c  Obstetrics & Gynecology Office Visit   Chief Complaint  Patient presents with   Urinary Tract Infection    Lots of lower abdominal cramping, low back pain and blood in urine Monday & Tuesday   Fibroids   History of Present Illness: 45 y.o. G3P3 female who presents with some issues.   She had an ablation in 02/2020.  She has had no menses since that time. She has had some symptoms of periods (cramps, back pains).  In March 2022, she went to the ER due to hurting in her abdomen (lower mid abdomen) with radiation around to her back and buttocks. She had imaging which showed a fibroid tumor and sciatic issues.  She continues to have cramping in her stomach.  She states that part of her stomach is numb and she has had this for a few weeks. She went to another doctor and she had a swab. She states that this was negative. However, she was given 5 days of flagyl.   Since her surgery she has had more sweat.  She has had an accumulation of yeast. So, she took medication for yeast. She is worried about an infection, a cancer.  She is worried about an infection with her bladder or kidneys.  She is also worried about pelvic inflammatory disease.  She always uses protection. But, she'd rather be safe.      09/2019 = pap ASCUS/HPV+, colpo 10/2019 = CIN 1 02/20/2020 = HSC, Mirena IUD removed, novasure endometrial ablation  Lots of lower abdominal cramping, low back pain and blood in urine Monday & Tuesday. CT Scan [facility] that showed Fibroids 09/2020. The records may have been sent to her PCP.    Pain in abdomen:  alleviating factors = hydrocodone. She doesn't like taking ibuprofen due to her bypass surgery. Tylenol and Mobic don't really work.  Aggravating factors: unsure.  Associated factors: shooting cramps.  Urine smells concentrated.  Denies dysuria.    Past Medical History:  Diagnosis Date   Anemia    Depression    GERD (gastroesophageal reflux disease)    History of hiatal hernia     Hypertension    Idiopathic hypersomnia     Past Surgical History:  Procedure Laterality Date   CESAREAN SECTION  x3   CHOLECYSTECTOMY, LAPAROSCOPIC     DILITATION & CURRETTAGE/HYSTROSCOPY WITH NOVASURE ABLATION N/A 02/20/2020   Procedure: DILATATION & CURETTAGE/HYSTEROSCOPY WITH NOVASURE ABLATION;  Surgeon: Malachy Mood, MD;  Location: [facility] ORS;  Service: Gynecology;  Laterality: N/A;   GASTRIC BYPASS     IUD REMOVAL N/A 02/20/2020   Procedure: MIRENA INTRAUTERINE DEVICE (IUD) REMOVAL;  Surgeon: Malachy Mood, MD;  Location: [facility] ORS;  Service: Gynecology;  Laterality: N/A;   left oopherectomy     TONSILLECTOMY     torn Rotator cuff Right     Gynecologic History: No LMP recorded. (Menstrual status: Other).  Obstetric History: G3P3  Family History  Problem Relation Age of Onset   Colon cancer Paternal Grandfather    Diabetes Maternal Grandfather    Kidney cancer Father    Diabetes Mother     Social History   Socioeconomic History   Marital status: Divorced    Spouse name: Not on file   Number of children: Not on file   Years of education: Not on file   Highest education level: Not on file  Occupational History   Not on file  Tobacco Use   Smoking status: Never   Smokeless tobacco: Never  Vaping Use   Vaping Use: Never used  Substance and Sexual Activity   Alcohol use: Never   Drug use: Never   Sexual activity: Not Currently    Birth control/protection: Surgical    Comment: Bilateral Tubal Ligation  Other Topics Concern   Not on file  Social History Narrative   Not on file   Social Determinants of Health   Financial Resource Strain: Not on file  Food Insecurity: Not on file  Transportation Needs: Not on file  Physical Activity: Not on file  Stress: Not on file  Social Connections: Not on file  Intimate Partner Violence: Not on file    Allergies  Allergen Reactions   Lisinopril Cough   Tape Rash    Prior to Admission medications    Medication Sig Start Date End Date Taking? Authorizing Provider  amLODipine (NORVASC) 10 MG tablet Take 10 mg by mouth at bedtime.    Yes [provider]  ammonium lactate (AMLACTIN) 12 % cream Apply topically 2 (two) times daily. 11/04/20  Yes [provider]  Armodafinil 250 MG tablet Take 250 mg by mouth daily. Daily before work 09/21/19  Yes [provider]  Cholecalciferol (VITAMIN D3) 50 MCG (2000 UT) TABS Take 4,000 Units by mouth daily.   Yes [provider]  DULoxetine (CYMBALTA) 60 MG capsule Take 60 mg by mouth daily. 12/09/20  Yes [provider]  gabapentin (NEURONTIN) 300 MG capsule Take 300 mg by mouth at bedtime. 12/26/20  Yes [provider]  hydrochlorothiazide (HYDRODIURIL) 25 MG tablet Take 25 mg by mouth daily.  07/30/19  Yes [provider]  losartan (COZAAR) 100 MG tablet Take 100 mg by mouth daily. 07/30/19  Yes [provider]  meloxicam (MOBIC) 15 MG tablet Take 15 mg by mouth daily. 12/04/20  Yes [provider]  metoprolol succinate (TOPROL-XL) 25 MG 24 hr tablet Take 25 mg by mouth daily. 07/30/19  Yes [provider]  pantoprazole (PROTONIX) 20 MG tablet Take 20 mg by mouth daily before breakfast.  09/29/19  Yes [provider]  phentermine 15 MG capsule Take 15 mg by mouth daily. 12/26/20  Yes [provider]  topiramate (TOPAMAX) 25 MG tablet Take 25 mg by mouth daily. 12/16/20  Yes [provider]  acetaminophen (TYLENOL) 500 MG tablet Take 500-1,000 mg by mouth every 6 (six) hours as needed (for pain.). Patient not taking: Reported on 01/03/2021    [provider]  Ascorbic Acid (VITAMIN C) 1000 MG tablet Take 1,000 mg by mouth daily. Patient not taking: Reported on 01/03/2021    [provider]  QSYMIA 15-92 MG CP24 Take 1 capsule by mouth daily. Patient not taking: Reported on 01/03/2021 03/19/20   [provider]  venlafaxine XR  (EFFEXOR-XR) 150 MG 24 hr capsule Take 150 mg by mouth daily. Patient not taking: Reported on 01/03/2021 07/30/19   [provider]    Review of Systems  Constitutional: Negative.   HENT: Negative.    Eyes: Negative.   Respiratory: Negative.    Cardiovascular: Negative.   Gastrointestinal: Negative.   Genitourinary: Negative.   Musculoskeletal: Negative.   Skin: Negative.   Neurological: Negative.   Psychiatric/Behavioral: Negative.      Physical Exam BP (!) 144/90 (Cuff Size: Large)   Pulse 94   Ht 5' 7.5" (1.715 m)   Wt 288 lb (130.6 kg)   BMI 44.44 kg/m  No LMP recorded. (Menstrual status: Other). Physical Exam Constitutional:  General: She is not in acute distress.    Appearance: Normal appearance. She is well-developed.  Genitourinary:     Vulva and bladder normal.     Right Labia: No rash, tenderness, lesions, skin changes or Bartholin's cyst.    Left Labia: No tenderness, lesions, skin changes, Bartholin's cyst or rash.    No inguinal adenopathy present in the right or left side.    Pelvic Tanner Score: 5/5.    No vaginal discharge, erythema, tenderness or bleeding.      Right Adnexa: not tender, not full and no mass present.    Left Adnexa: not tender, not full and no mass present.    No cervical motion tenderness, discharge, lesion or polyp.     Uterus is not enlarged or tender.     No uterine mass detected.    Pelvic exam was performed with patient in the lithotomy position.  Breasts:    Right: No inverted nipple, mass, nipple discharge, skin change or tenderness.     Left: No inverted nipple, mass, nipple discharge, skin change or tenderness.  HENT:     Head: Normocephalic and atraumatic.  Eyes:     General: No scleral icterus.    Conjunctiva/sclera: Conjunctivae normal.  Neck:     Thyroid: No thyromegaly.  Cardiovascular:     Rate and Rhythm: Normal rate and regular rhythm.     Heart sounds: No murmur heard.   No friction rub. No gallop.   Pulmonary:     Effort: Pulmonary effort is normal. No respiratory distress.     Breath sounds: Normal breath sounds. No wheezing or rales.  Abdominal:     General: Bowel sounds are normal. There is no distension.     Palpations: Abdomen is soft. There is no mass.     Tenderness: There is no abdominal tenderness. There is no guarding or rebound.     Hernia: There is no hernia in the left inguinal area or right inguinal area.  Musculoskeletal:        General: No swelling or tenderness. Normal range of motion.     Cervical back: Normal range of motion and neck supple.  Lymphadenopathy:     Cervical: No cervical adenopathy.     Lower Body: No right inguinal adenopathy. No left inguinal adenopathy.  Neurological:     General: No focal deficit present.     Mental Status: She is alert and oriented to person, place, and time.     Cranial Nerves: No cranial nerve deficit.  Skin:    General: Skin is warm and dry.     Findings: No erythema or rash.  Psychiatric:        Mood and Affect: Mood normal.        Behavior: Behavior normal.        Judgment: Judgment normal.    Female chaperone present for pelvic and breast  portions of the physical exam  Assessment: 45 y.o. G3P3 female here for  1. Lower abdominal pain   2. Hematuria, unspecified type   3. CIN I (cervical intraepithelial neoplasia I)   4. Chronic bilateral low back pain with bilateral sciatica      Plan: Problem List Items Addressed This Visit   None Visit Diagnoses     Lower abdominal pain    -  Primary   Relevant Orders   Urine Culture   Cytology - PAP   Cervicovaginal ancillary only   Comprehensive metabolic panel   Hematuria, unspecified type  Relevant Orders   POCT Urinalysis Dipstick   Urine Culture   Comprehensive metabolic panel   CIN I (cervical intraepithelial neoplasia I)       Relevant Orders   Cytology - PAP   Chronic bilateral low back pain with bilateral sciatica       Relevant Medications    DULoxetine (CYMBALTA) 60 MG capsule   gabapentin (NEURONTIN) 300 MG capsule   meloxicam (MOBIC) 15 MG tablet   phentermine 15 MG capsule   topiramate (TOPAMAX) 25 MG tablet   Other Relevant Orders   Comprehensive metabolic panel       Prentice Docker, MD 01/03/2021 2:24 PM

## 2021-01-04 LAB — COMPREHENSIVE METABOLIC PANEL
ALT: 14 IU/L (ref 0–32)
AST: 13 IU/L (ref 0–40)
Albumin/Globulin Ratio: 1.6 (ref 1.2–2.2)
Albumin: 4.1 g/dL (ref 3.8–4.8)
Alkaline Phosphatase: 187 IU/L — ABNORMAL HIGH (ref 44–121)
BUN/Creatinine Ratio: 15 (ref 9–23)
BUN: 12 mg/dL (ref 6–24)
Bilirubin Total: 0.5 mg/dL (ref 0.0–1.2)
CO2: 28 mmol/L (ref 20–29)
Calcium: 9.4 mg/dL (ref 8.7–10.2)
Chloride: 100 mmol/L (ref 96–106)
Creatinine, Ser: 0.82 mg/dL (ref 0.57–1.00)
Globulin, Total: 2.6 g/dL (ref 1.5–4.5)
Glucose: 97 mg/dL (ref 65–99)
Potassium: 3.6 mmol/L (ref 3.5–5.2)
Sodium: 141 mmol/L (ref 134–144)
Total Protein: 6.7 g/dL (ref 6.0–8.5)
eGFR: 90 mL/min/{1.73_m2} (ref >59)

## 2021-01-05 LAB — URINE CULTURE

## 2021-01-08 ENCOUNTER — Telehealth: Payer: Self-pay

## 2021-01-08 ENCOUNTER — Encounter: Payer: Self-pay | Admitting: Obstetrics and Gynecology

## 2021-01-08 LAB — CERVICOVAGINAL ANCILLARY ONLY
Chlamydia: NEGATIVE
Comment: NEGATIVE
Comment: NEGATIVE
Comment: NORMAL
Neisseria Gonorrhea: NEGATIVE
Trichomonas: NEGATIVE

## 2021-01-08 NOTE — Telephone Encounter (Signed)
Pt calling for lab results.  [phone]

## 2021-01-09 DIAGNOSIS — Z6841 Body Mass Index (BMI) 40.0 and over, adult: Secondary | ICD-10-CM | POA: Insufficient documentation

## 2021-01-09 DIAGNOSIS — G4711 Idiopathic hypersomnia with long sleep time: Secondary | ICD-10-CM | POA: Insufficient documentation

## 2021-01-09 NOTE — Telephone Encounter (Signed)
Discussed that only abnormal lab is alkaline phosphatase.  This is a fairly nonspecific finding, though can be an indicator of biliary tree issues.  Plan for repeat in 1 month. Discussed that Pap smear results have not come back yet.  All other testing reassuring.  I have not yet received her records regarding her fibroids.  We will continue to watch for these. If her Pap smear is abnormal, she will need a repeat colposcopy. All questions answered

## 2021-01-15 LAB — CYTOLOGY - PAP
Comment: NEGATIVE
Diagnosis: UNDETERMINED — AB
High risk HPV: NEGATIVE

## 2021-01-22 ENCOUNTER — Ambulatory Visit: Payer: 59 | Admitting: Obstetrics and Gynecology

## 2021-02-03 ENCOUNTER — Ambulatory Visit (INDEPENDENT_AMBULATORY_CARE_PROVIDER_SITE_OTHER): Payer: 59 | Admitting: Obstetrics and Gynecology

## 2021-02-03 ENCOUNTER — Encounter: Payer: Self-pay | Admitting: Obstetrics and Gynecology

## 2021-02-03 ENCOUNTER — Other Ambulatory Visit: Payer: Self-pay

## 2021-02-03 VITALS — BP 148/92 | Ht 67.5 in | Wt 288.0 lb

## 2021-02-03 DIAGNOSIS — R102 Pelvic and perineal pain: Secondary | ICD-10-CM

## 2021-02-03 NOTE — Progress Notes (Signed)
Gynecology Pelvic Pain Evaluation   Chief Complaint:  Chief Complaint  Patient presents with   Follow-up    Abdominal pain - RM 5     History of Present Illness:   Patient is a 45 y.o. G3P3 who LMP was No LMP recorded. (Menstrual status: Other)., presents today for a problem visit.  She complains of abdominal pain and cramping.  She is status post novasure endometrial ablation 02/19/2021, she wsa previously managed unsuccessfully for AUB with a Mirena IUD.    TVUS 10/30/2019 and 12/25/2019 normal, no evidence of uterine fibroids, [place] 11/05/2016 [facility] showing some subendometrial cysts so potential of adenomyosis.   The patient has a colposcopy 10/17/2019 for a ASCUS HPV positive pap as well as endometrial sampling.  Colposcopy revealed CIN I, with endometrial biopsy showing normal proliferative endometrium.  She has a repeat pap on 01/03/2021 which was ASCUS HPV negative  Her pain is localized to the deep pelvis area, described as intermittent and cramping , began several months ago and its severity is described as moderate. The pain radiates to the  does radiate into back.  She has noted some light bleeding unclear vaginal or urine.  Did have CT san in March of this year at outside hospital showing possible uterine fibroids.   Review of Systems: Review of Systems  Constitutional: Negative.   Gastrointestinal:  Positive for abdominal pain. Negative for constipation, diarrhea, nausea and vomiting.  Genitourinary: Negative.   Skin: Negative.    Past Medical History:  Patient Active Problem List   Diagnosis Date Noted   Abnormal uterine bleeding (AUB)     Past Surgical History:  Past Surgical History:  Procedure Laterality Date   CESAREAN SECTION  x3   CHOLECYSTECTOMY, LAPAROSCOPIC     DILITATION & CURRETTAGE/HYSTROSCOPY WITH NOVASURE ABLATION N/A 02/20/2020   Procedure: DILATATION & CURETTAGE/HYSTEROSCOPY WITH NOVASURE ABLATION;  Surgeon: Malachy Mood, MD;  Location: [facility] ORS;   Service: Gynecology;  Laterality: N/A;   GASTRIC BYPASS     IUD REMOVAL N/A 02/20/2020   Procedure: MIRENA INTRAUTERINE DEVICE (IUD) REMOVAL;  Surgeon: Malachy Mood, MD;  Location: [facility] ORS;  Service: Gynecology;  Laterality: N/A;   left oopherectomy     TONSILLECTOMY     torn Rotator cuff Right     Gynecologic History:  No LMP recorded. (Menstrual status: Other).  Obstetric History: G3P3  Family History:  Family History  Problem Relation Age of Onset   Colon cancer Paternal Grandfather    Diabetes Maternal Grandfather    Kidney cancer Father    Diabetes Mother     Social History:  Social History   Socioeconomic History   Marital status: Divorced    Spouse name: Not on file   Number of children: Not on file   Years of education: Not on file   Highest education level: Not on file  Occupational History   Not on file  Tobacco Use   Smoking status: Never   Smokeless tobacco: Never  Vaping Use   Vaping Use: Never used  Substance and Sexual Activity   Alcohol use: Never   Drug use: Never   Sexual activity: Not Currently    Birth control/protection: Surgical    Comment: Bilateral Tubal Ligation  Other Topics Concern   Not on file  Social History Narrative   Not on file   Social Determinants of Health   Financial Resource Strain: Not on file  Food Insecurity: Not on file  Transportation Needs: Not on file  Physical Activity: Not on file  Stress: Not on file  Social Connections: Not on file  Intimate Partner Violence: Not on file    Allergies:  Allergies  Allergen Reactions   Lisinopril Cough   Tape Rash    Medications: Prior to Admission medications   Medication Sig Start Date End Date Taking? Authorizing Provider  amLODipine (NORVASC) 10 MG tablet Take 10 mg by mouth at bedtime.    Yes [provider]  ammonium lactate (AMLACTIN) 12 % cream Apply topically 2 (two) times daily. 11/04/20  Yes [provider]   amphetamine-dextroamphetamine (ADDERALL) 15 MG tablet Take 1 tablet by mouth 2 (two) times daily. 01/09/21  Yes [provider]  Cholecalciferol (VITAMIN D3) 50 MCG (2000 UT) TABS Take 4,000 Units by mouth daily.   Yes [provider]  DULoxetine (CYMBALTA) 60 MG capsule Take 60 mg by mouth daily. 12/09/20  Yes [provider]  gabapentin (NEURONTIN) 300 MG capsule Take 300 mg by mouth at bedtime. 12/26/20  Yes [provider]  hydrochlorothiazide (HYDRODIURIL) 25 MG tablet Take 25 mg by mouth daily.  07/30/19  Yes [provider]  losartan (COZAAR) 100 MG tablet Take 100 mg by mouth daily. 07/30/19  Yes [provider]  meloxicam (MOBIC) 15 MG tablet Take 15 mg by mouth daily. 12/04/20  Yes [provider]  metoprolol succinate (TOPROL-XL) 25 MG 24 hr tablet Take 25 mg by mouth daily. 07/30/19  Yes [provider]  pantoprazole (PROTONIX) 20 MG tablet Take 20 mg by mouth daily before breakfast.  09/29/19  Yes [provider]  phentermine 15 MG capsule Take 15 mg by mouth daily. 12/26/20  Yes [provider]  topiramate (TOPAMAX) 25 MG tablet Take 25 mg by mouth daily. 12/16/20  Yes [provider]  acetaminophen (TYLENOL) 500 MG tablet Take 500-1,000 mg by mouth every 6 (six) hours as needed (for pain.). Patient not taking: Reported on 01/03/2021    [provider]  Armodafinil 250 MG tablet Take 250 mg by mouth daily. Daily before work Patient not taking: Reported on 02/03/2021 09/21/19   [provider]  Ascorbic Acid (VITAMIN C) 1000 MG tablet Take 1,000 mg by mouth daily. Patient not taking: Reported on 01/03/2021    [provider]    Physical Exam Vitals: Blood pressure (!) 148/92, height 5' 7.5" (1.715 m), weight 288 lb (130.6 kg).  General: NAD HEENT: normocephalic, anicteric Pulmonary: No increased work of breathing Neurologic: Grossly intact Psychiatric: mood appropriate,  affect full  Female chaperone present for pelvic portion of the physical exam  Assessment: 45 y.o. G3P3 with abdominal pain  Problem List Items Addressed This Visit   None Visit Diagnoses     Pelvic pain    -  Primary   Relevant Orders   US PELVIS TRANSVAGINAL NON-OB (TV ONLY)        1) We discussed the possible etiologies for pelvic pain in women.  Gynecologic causes may include endometriosis, adenomyosis, pelvic inflammatory disease (PID), ovarian cysts, ovarian or tubal torsion, and in rare case gynecologic malignancy such as cervical, uterine, or ovarian cancer.  In addition thee possibility of non-gynecologic etiologies such as urinary or GI tract pathology or disordered, as well as musculoskeletal problems.  The goal is to complete a basic work up in hopes of identifying the underlying cause which in turn will dictate treatment.  In the meantime supportive measures such as localized heat, and NSAIDs are reasonable first steps.   -  Will obtain TVUS to evaluate adnexa as well as rule out possible hematometra  secondary to ablation failure   2) ASCUS HPV negative  pap 01/03/2021 - needs colposcopy      2)  .A total of 20 minutes were spent in face-to-face contact with the patient during this encounter with over half of that time devoted to counseling and coordination of care.  3) Return Follow up after [place] imaging appointment for colposcopy and [place] follow up.   Malachy Mood, MD, [facility], [facility] 02/03/2021, 3:42 PM

## 2021-02-14 ENCOUNTER — Other Ambulatory Visit: Payer: Self-pay | Admitting: Obstetrics and Gynecology

## 2021-02-18 ENCOUNTER — Other Ambulatory Visit: Payer: Self-pay

## 2021-02-18 ENCOUNTER — Ambulatory Visit (INDEPENDENT_AMBULATORY_CARE_PROVIDER_SITE_OTHER): Payer: 59 | Admitting: Obstetrics and Gynecology

## 2021-02-18 ENCOUNTER — Encounter: Payer: Self-pay | Admitting: Obstetrics and Gynecology

## 2021-02-18 VITALS — BP 148/92 | Wt 283.0 lb

## 2021-02-18 DIAGNOSIS — N857 Hematometra: Secondary | ICD-10-CM

## 2021-02-18 DIAGNOSIS — R1030 Lower abdominal pain, unspecified: Secondary | ICD-10-CM | POA: Diagnosis not present

## 2021-02-18 NOTE — Progress Notes (Signed)
Gynecology Ultrasound Follow Up  Chief Complaint:  Chief Complaint  Patient presents with   Follow-up     History of Present Illness: Patient is a 45 y.o. female who presents today for ultrasound evaluation of pelvic pain.  Patient has ultrasound done 02/16/2021 at [facility] ER.  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   Gastrointestinal:  Positive for abdominal pain. Negative for constipation, diarrhea, nausea and vomiting.  Genitourinary: Negative.    Past Medical History:  Past Medical History:  Diagnosis Date   Anemia    Depression    GERD (gastroesophageal reflux disease)    History of hiatal hernia    Hypertension    Idiopathic hypersomnia     Past Surgical History:  Past Surgical History:  Procedure Laterality Date   CESAREAN SECTION  x3   CHOLECYSTECTOMY, LAPAROSCOPIC     DILITATION & CURRETTAGE/HYSTROSCOPY WITH NOVASURE ABLATION N/A 02/20/2020   Procedure: DILATATION & CURETTAGE/HYSTEROSCOPY WITH NOVASURE ABLATION;  Surgeon: Malachy Mood, MD;  Location: [facility] ORS;  Service: Gynecology;  Laterality: N/A;   GASTRIC BYPASS     IUD REMOVAL N/A 02/20/2020   Procedure: MIRENA INTRAUTERINE DEVICE (IUD) REMOVAL;  Surgeon: Malachy Mood, MD;  Location: [facility] ORS;  Service: Gynecology;  Laterality: N/A;   left oopherectomy     TONSILLECTOMY     torn Rotator cuff Right     Gynecologic History:  No LMP recorded. (Menstrual status: Other).  Family History:  Family History  Problem Relation Age of Onset   Colon cancer Paternal Grandfather    Diabetes Maternal Grandfather    Kidney cancer  Father    Diabetes Mother     Social History:  Social History   Socioeconomic History   Marital status: Divorced    Spouse name: Not on file   Number of children: Not on file   Years of education: Not on file   Highest education level: Not on file  Occupational History   Not on file  Tobacco Use   Smoking status: Never   Smokeless tobacco: Never  Vaping Use   Vaping Use: Never used  Substance and Sexual Activity   Alcohol use: Never   Drug use: Never   Sexual activity: Not Currently    Birth control/protection: Surgical    Comment: Bilateral Tubal Ligation  Other Topics Concern   Not on file  Social History Narrative   Not on file   Social Determinants of Health   Financial Resource Strain: Not on file  Food Insecurity: Not on file  Transportation Needs: Not on file  Physical Activity: Not on file  Stress: Not on file  Social Connections: Not on file  Intimate Partner Violence: Not on file    Allergies:  Allergies  Allergen Reactions   Lisinopril Cough   Tape Rash    Medications: Prior to Admission medications   Medication Sig Start Date End Date Taking? Authorizing Provider  acetaminophen (TYLENOL) 500 MG tablet Take 500-1,000 mg by mouth every 6 (six) hours as needed (for pain.). Patient not taking: Reported on 01/03/2021    [provider]  amLODipine (NORVASC) 10 MG tablet Take 10 mg by mouth at bedtime.  [provider]  ammonium lactate (AMLACTIN) 12 % cream Apply topically 2 (two) times daily. 11/04/20   [provider]  amphetamine-dextroamphetamine (ADDERALL) 15 MG tablet Take 1 tablet by mouth 2 (two) times daily. 01/09/21   [provider]  Armodafinil 250 MG tablet Take 250 mg by mouth daily. Daily before work Patient not taking: Reported on 02/03/2021 09/21/19   [provider]  Ascorbic Acid (VITAMIN C) 1000 MG tablet Take 1,000 mg by mouth daily. Patient not taking: Reported on 01/03/2021    [provider]  Cholecalciferol (VITAMIN D3) 50 MCG (2000 UT) TABS Take 4,000 Units by mouth daily.    [provider]  DULoxetine (CYMBALTA) 60 MG capsule Take 60 mg by mouth daily. 12/09/20   [provider]  gabapentin (NEURONTIN) 300 MG capsule Take 300 mg by mouth at bedtime. 12/26/20   [provider]  hydrochlorothiazide (HYDRODIURIL) 25 MG tablet Take 25 mg by mouth daily.  07/30/19   [provider]  losartan (COZAAR) 100 MG tablet Take 100 mg by mouth daily. 07/30/19   [provider]  meloxicam (MOBIC) 15 MG tablet Take 15 mg by mouth daily. 12/04/20   [provider]  metoprolol succinate (TOPROL-XL) 25 MG 24 hr tablet Take 25 mg by mouth daily. 07/30/19   [provider]  pantoprazole (PROTONIX) 20 MG tablet Take 20 mg by mouth daily before breakfast.  09/29/19   [provider]  phentermine 15 MG capsule Take 15 mg by mouth daily. 12/26/20   [provider]  topiramate (TOPAMAX) 25 MG tablet Take 25 mg by mouth daily. 12/16/20   [provider]    Physical Exam Vitals: Blood pressure (!) 148/92, weight 283 lb (128.4 kg).  General: NAD HEENT: normocephalic, anicteric Pulmonary: No increased work of breathing Extremities: no edema, erythema, or tenderness Neurologic: Grossly intact, normal gait Psychiatric: mood appropriate, affect full   Assessment: 45 y.o. G3P3 with likely 45 y.o. G3P3  post-ablation tubal sterilizaton syndrome (PATSS)      Plan: Problem List Items Addressed This Visit   None Visit Diagnoses     Hematometra    -  Primary   Lower abdominal pain           1) Patient opts for definitive surgical management via hysterectomy. The risks of surgery were discussed in detail with the patient including but not limited to: bleeding which may require transfusion or reoperation; infection which may require antibiotics; injury to bowel, bladder, ureters or other surrounding organs  (With a literature reported rate of urinary tract injury of 1% quoted); need for additional procedures including laparotomy; thromboembolic phenomenon, incisional problems and other postoperative/anesthesia complications.  Patient was also advised that recovery procedure generally involves an overnight stay; and the  expected recovery time after a hysterectomy being in the range of 6-8 weeks.  Likelihood of success in alleviating the patient's symptoms was discussed.  While definitive in regards to issues with menstural bleeding, pelvic pain if present preoperatively may continue and in fact worsen postoperatively.  She is aware that the procedure will render her unable to pursue childbearing in the future.   She was told that she will be contacted by our surgical scheduler regarding the time and date of her surgery; routine preoperative instructions of having nothing to eat or drink after midnight on the day prior to surgery and also coming to the hospital 1.5 hours prior to her time of surgery were also emphasized.  She  was told she may be called for a preoperative appointment about a week prior to surgery and will be given further preoperative instructions at that visit.  Routine postoperative instructions will be reviewed with the patient and her family in detail after surgery. Printed patient education handouts about the procedure was given to the patient to review at home.    Malachy Mood, MD, [facility], [facility] 02/18/2021, 3:18 PM

## 2021-02-25 ENCOUNTER — Telehealth: Payer: Self-pay

## 2021-02-25 NOTE — Telephone Encounter (Signed)
-----   Message from Malachy Mood, MD sent at 02/19/2021 12:32 PM EDT ----- Regarding: Surgery Surgery Booking Request Patient Full Name:  Sharon Zimmerman  MRN: [id number]  DOB: 11/28/1975  Surgeon: Malachy Mood, MD  Requested Surgery Date and Time: Patient preference Primary Diagnosis AND Code: Abdominal pain R10.30 Secondary Diagnosis and Code: hematometra N85.7 Surgical Procedure: TLH/BS RNFA Requested?: No L&D Notification: No Admission Status: observation Length of Surgery: 125 min Special Case Needs: No H&P: Yes Phone Interview???:  Yes Interpreter: No Medical Clearance:  No Special Scheduling Instructions: No Any known health/anesthesia issues, diabetes, sleep apnea, latex allergy, defibrillator/pacemaker?: No Acuity: P2   (P1 highest, P2 delay may cause harm, P3 low, elective gyn, P4 lowest) Post op follow up visits: 1 week and 6 week

## 2021-02-25 NOTE — Telephone Encounter (Signed)
Called patient to schedule TLH/BS w Georgianne Fick  DOS 9/29  H&P 9/23 @ 1050   Pre-admit phone call appointment to be requested - date and time will be included on H&P paper work. Also all appointments will be updated on pt MyChart. Explained that this appointment has a call window. Based on the time scheduled will indicate if the call will be received within a 4 hour window before 1:00 or after.  Advised that pt may also receive calls from the hospital pharmacy and pre-service center.  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 No secondary insurance.   **Patient stated that she was in so much pain on a daily basis, sometimes it's hard to get out of bed. She asked if there was anything that AMS could give her for the pain. I advised that I would forward her request to him.

## 2021-03-03 NOTE — Telephone Encounter (Signed)
Pt called and wanted to change her DOS.   New DOS 9/8  H&P 9/7 @ 11:30 in [place]

## 2021-03-06 ENCOUNTER — Telehealth: Payer: Self-pay

## 2021-03-06 ENCOUNTER — Other Ambulatory Visit
Admission: RE | Admit: 2021-03-06 | Discharge: 2021-03-06 | Disposition: A | Discharge status: Home / Self Care | Payer: Medicaid - Out of State | Source: Ambulatory Visit | Attending: Obstetrics and Gynecology | Admitting: Obstetrics and Gynecology

## 2021-03-06 ENCOUNTER — Other Ambulatory Visit: Payer: Self-pay

## 2021-03-06 HISTORY — DX: Prediabetes: R73.03

## 2021-03-06 HISTORY — DX: Sleep apnea, unspecified: G47.30

## 2021-03-06 HISTORY — DX: Other complications of anesthesia, initial encounter: T88.59XA

## 2021-03-06 NOTE — Patient Instructions (Signed)
Your procedure is scheduled on: Thursday March 13, 2021. Report to Day Surgery inside [facility] 2nd floor stop by admissions desk first before getting on elevator. To find out your arrival time please call [phone] between 1PM - 3PM on Wednesday March 12, 2021.  Remember: Instructions that are not followed completely may result in serious medical risk,  up to and including death, or upon the discretion of your surgeon and anesthesiologist your  surgery may need to be rescheduled.     _X__ 1. Do not eat food or drink fluids after midnight the night before your procedure.                 No chewing gum or hard candies.   __X__2.  On the morning of surgery brush your teeth with toothpaste and water, you                may rinse your mouth with mouthwash if you wish.  Do not swallow any toothpaste of mouthwash.     _X__ 3.  No Alcohol for 24 hours before or after surgery.   _X__ 4.  Do Not Smoke or use e-cigarettes For 24 Hours Prior to Your Surgery.                 Do not use any chewable tobacco products for at least 6 hours prior to                 Surgery.  _X__  5.  Do not use any recreational drugs (marijuana, cocaine, heroin, ecstasy, MDMA or other)                For at least one week prior to your surgery.  Combination of these drugs with anesthesia                May have life threatening results.  __X__ 6.  Notify your doctor if there is any change in your medical condition      (cold, fever, infections).     Do not wear jewelry, make-up, hairpins, clips or nail polish. Do not wear lotions, powders, or perfumes. You may wear deodorant. Do not shave 48 hours prior to surgery.  Do not bring valuables to the hospital.    [facility] is not responsible for any belongings or valuables.  Contacts, dentures or bridgework may not be worn into surgery. Leave your suitcase in the car. After surgery it may be brought to your room. For patients  admitted to the hospital, discharge time is determined by your treatment team.   Patients discharged the day of surgery will not be allowed to drive home.   Make arrangements for someone to be with you for the first 24 hours of your Same Day Discharge.   __X__ Take these medicines the morning of surgery with A SIP OF WATER:    1. metoprolol succinate (TOPROL-XL) 25 MG  2. pantoprazole (PROTONIX) 20 MG   3.   4.  5.  6.  ____ Fleet Enema (as directed)   __X__ Use CHG Soap (or wipes) as directed  ____ Use Benzoyl Peroxide Gel as instructed  ____ Use inhalers on the day of surgery  ____ Stop metformin 2 days prior to surgery    ____ Take 1/2 of usual insulin dose the night before surgery. No insulin the morning          of surgery.   ____ Call your PCP, cardiologist, or Pulmonologist if taking  Coumadin/Plavix/aspirin and ask when to stop before your surgery.   __X__ One Week prior to surgery- Stop Anti-inflammatories such as Ibuprofen, Aleve, Advil, Motrin, meloxicam (MOBIC), diclofenac, etodolac, ketorolac, Toradol, Daypro, piroxicam, Goody's or BC powders. OK TO USE TYLENOL IF NEEDED   __X__ Stop supplements until after surgery.    __X__ Bring C-Pap to the hospital.    If you have any questions regarding your pre-procedure instructions,  Please call Pre-admit Testing at [phone]

## 2021-03-06 NOTE — Telephone Encounter (Signed)
Pt called questioning what days she had her appts on because she still had info written down from when it was scheduled further out. I read off the appts to her and reiterated that her H&P would be in [place] with [employer].

## 2021-03-07 ENCOUNTER — Other Ambulatory Visit: Payer: Medicaid - Out of State

## 2021-03-07 ENCOUNTER — Other Ambulatory Visit: Payer: Self-pay | Admitting: Obstetrics and Gynecology

## 2021-03-07 MED ORDER — HYDROCODONE-ACETAMINOPHEN 5-325 MG PO TABS: 1.0000 | ORAL_TABLET | Freq: Four times a day (QID) | ORAL | 0 refills | Status: DC | PRN

## 2021-03-11 ENCOUNTER — Encounter: Payer: Self-pay | Admitting: Urgent Care

## 2021-03-11 ENCOUNTER — Other Ambulatory Visit: Payer: Self-pay

## 2021-03-11 ENCOUNTER — Other Ambulatory Visit
Admission: RE | Admit: 2021-03-11 | Discharge: 2021-03-11 | Disposition: A | Discharge status: Home / Self Care | Payer: 59 | Source: Ambulatory Visit | Attending: Obstetrics and Gynecology | Admitting: Obstetrics and Gynecology

## 2021-03-11 DIAGNOSIS — I10 Essential (primary) hypertension: Secondary | ICD-10-CM | POA: Insufficient documentation

## 2021-03-11 DIAGNOSIS — Z01818 Encounter for other preprocedural examination: Secondary | ICD-10-CM | POA: Insufficient documentation

## 2021-03-11 DIAGNOSIS — Z20822 Contact with and (suspected) exposure to covid-19: Secondary | ICD-10-CM | POA: Insufficient documentation

## 2021-03-11 DIAGNOSIS — Z01810 Encounter for preprocedural cardiovascular examination: Secondary | ICD-10-CM

## 2021-03-11 LAB — COMPREHENSIVE METABOLIC PANEL
ALT: 12 U/L (ref 0–44)
AST: 13 U/L — ABNORMAL LOW (ref 15–41)
Albumin: 3.6 g/dL (ref 3.5–5.0)
Alkaline Phosphatase: 132 U/L — ABNORMAL HIGH (ref 38–126)
Anion gap: 7 (ref 5–15)
BUN: 14 mg/dL (ref 6–20)
CO2: 30 mmol/L (ref 22–32)
Calcium: 9.1 mg/dL (ref 8.9–10.3)
Chloride: 102 mmol/L (ref 98–111)
Creatinine, Ser: 0.86 mg/dL (ref 0.44–1.00)
GFR, Estimated: >60 mL/min (ref >60)
Glucose, Bld: 91 mg/dL (ref 70–99)
Potassium: 3.2 mmol/L — ABNORMAL LOW (ref 3.5–5.1)
Sodium: 139 mmol/L (ref 135–145)
Total Bilirubin: 0.6 mg/dL (ref 0.3–1.2)
Total Protein: 6.9 g/dL (ref 6.5–8.1)

## 2021-03-11 LAB — TYPE AND SCREEN
ABO/RH(D): B POS
Antibody Screen: NEGATIVE

## 2021-03-11 LAB — CBC
HCT: 39 % (ref 36.0–46.0)
Hemoglobin: 12.7 g/dL (ref 12.0–15.0)
MCH: 26.8 pg (ref 26.0–34.0)
MCHC: 32.6 g/dL (ref 30.0–36.0)
MCV: 82.3 fL (ref 80.0–100.0)
Platelets: 345 10*3/uL (ref 150–400)
RBC: 4.74 MIL/uL (ref 3.87–5.11)
RDW: 13.6 % (ref 11.5–15.5)
WBC: 5.4 10*3/uL (ref 4.0–10.5)
nRBC: 0 % (ref 0.0–0.2)

## 2021-03-11 LAB — SARS CORONAVIRUS 2 (TAT 6-24 HRS): SARS Coronavirus 2: NEGATIVE

## 2021-03-12 ENCOUNTER — Encounter: Payer: Self-pay | Admitting: Obstetrics and Gynecology

## 2021-03-12 ENCOUNTER — Ambulatory Visit (INDEPENDENT_AMBULATORY_CARE_PROVIDER_SITE_OTHER): Payer: 59 | Admitting: Obstetrics and Gynecology

## 2021-03-12 ENCOUNTER — Encounter: Payer: 59 | Admitting: Obstetrics and Gynecology

## 2021-03-12 VITALS — BP 138/86 | Ht 67.5 in | Wt 279.0 lb

## 2021-03-12 DIAGNOSIS — N857 Hematometra: Secondary | ICD-10-CM | POA: Diagnosis not present

## 2021-03-12 DIAGNOSIS — Z01818 Encounter for other preprocedural examination: Secondary | ICD-10-CM

## 2021-03-12 NOTE — Telephone Encounter (Signed)
Seen in clinic today.

## 2021-03-13 ENCOUNTER — Observation Stay
Admission: RE | Admit: 2021-03-13 | Discharge: 2021-03-14 | Disposition: A | Discharge status: Home / Self Care | Payer: 59 | Attending: Obstetrics and Gynecology | Admitting: Obstetrics and Gynecology

## 2021-03-13 ENCOUNTER — Encounter
Admission: RE | Disposition: A | Discharge status: Home / Self Care | Payer: Self-pay | Source: Home / Self Care | Attending: Obstetrics and Gynecology

## 2021-03-13 ENCOUNTER — Other Ambulatory Visit: Payer: Self-pay

## 2021-03-13 ENCOUNTER — Observation Stay: Payer: 59 | Admitting: Anesthesiology

## 2021-03-13 ENCOUNTER — Encounter: Payer: Self-pay | Admitting: Obstetrics and Gynecology

## 2021-03-13 DIAGNOSIS — D259 Leiomyoma of uterus, unspecified: Secondary | ICD-10-CM | POA: Diagnosis not present

## 2021-03-13 DIAGNOSIS — N736 Female pelvic peritoneal adhesions (postinfective): Secondary | ICD-10-CM | POA: Diagnosis not present

## 2021-03-13 DIAGNOSIS — N800 Endometriosis of uterus: Secondary | ICD-10-CM

## 2021-03-13 DIAGNOSIS — N857 Hematometra: Secondary | ICD-10-CM | POA: Diagnosis not present

## 2021-03-13 DIAGNOSIS — R102 Pelvic and perineal pain: Secondary | ICD-10-CM

## 2021-03-13 DIAGNOSIS — Z79899 Other long term (current) drug therapy: Secondary | ICD-10-CM | POA: Diagnosis not present

## 2021-03-13 DIAGNOSIS — N858 Other specified noninflammatory disorders of uterus: Secondary | ICD-10-CM

## 2021-03-13 DIAGNOSIS — Z90710 Acquired absence of both cervix and uterus: Secondary | ICD-10-CM | POA: Diagnosis present

## 2021-03-13 HISTORY — PX: TOTAL LAPAROSCOPIC HYSTERECTOMY WITH SALPINGECTOMY: SHX6742

## 2021-03-13 HISTORY — PX: CYSTOSCOPY: SHX5120

## 2021-03-13 LAB — ABO/RH: ABO/RH(D): B POS

## 2021-03-13 LAB — POCT PREGNANCY, URINE: Preg Test, Ur: NEGATIVE

## 2021-03-13 SURGERY — HYSTERECTOMY, TOTAL, LAPAROSCOPIC, WITH SALPINGECTOMY
Anesthesia: General

## 2021-03-13 MED ORDER — ACETAMINOPHEN 10 MG/ML IV SOLN
INTRAVENOUS | Status: AC
  Filled 2021-03-13: qty 100

## 2021-03-13 MED ORDER — BUPIVACAINE HCL (PF) 0.5 % IJ SOLN
INTRAMUSCULAR | Status: AC
  Filled 2021-03-13: qty 30

## 2021-03-13 MED ORDER — CHLORHEXIDINE GLUCONATE 0.12 % MT SOLN: 15.0000 mL | Freq: Once | OROMUCOSAL | Status: AC

## 2021-03-13 MED ORDER — 0.9 % SODIUM CHLORIDE (POUR BTL) OPTIME
TOPICAL | Status: DC | PRN
  Administered 2021-03-13: 100 mL

## 2021-03-13 MED ORDER — ONDANSETRON HCL 4 MG PO TABS: 4.0000 mg | ORAL_TABLET | Freq: Four times a day (QID) | ORAL | Status: DC | PRN

## 2021-03-13 MED ORDER — DEXAMETHASONE SODIUM PHOSPHATE 10 MG/ML IJ SOLN
INTRAMUSCULAR | Status: DC | PRN
  Administered 2021-03-13: 10 mg via INTRAVENOUS

## 2021-03-13 MED ORDER — ROCURONIUM BROMIDE 100 MG/10ML IV SOLN
INTRAVENOUS | Status: DC | PRN
  Administered 2021-03-13: 20 mg via INTRAVENOUS
  Administered 2021-03-13: 10 mg via INTRAVENOUS
  Administered 2021-03-13: 20 mg via INTRAVENOUS
  Administered 2021-03-13: 50 mg via INTRAVENOUS
  Administered 2021-03-13: 30 mg via INTRAVENOUS
  Administered 2021-03-13: 20 mg via INTRAVENOUS

## 2021-03-13 MED ORDER — GABAPENTIN 300 MG PO CAPS
300.0000 mg | ORAL_CAPSULE | Freq: Every day | ORAL | Status: DC
  Administered 2021-03-13: 300 mg via ORAL
  Filled 2021-03-13: qty 1

## 2021-03-13 MED ORDER — CHLORHEXIDINE GLUCONATE 0.12 % MT SOLN
OROMUCOSAL | Status: AC
  Administered 2021-03-13: 15 mL via OROMUCOSAL
  Filled 2021-03-13: qty 15

## 2021-03-13 MED ORDER — SODIUM CHLORIDE (PF) 0.9 % IJ SOLN
INTRAMUSCULAR | Status: AC
  Filled 2021-03-13: qty 10

## 2021-03-13 MED ORDER — DEXMEDETOMIDINE (PRECEDEX) IN NS 20 MCG/5ML (4 MCG/ML) IV SYRINGE
PREFILLED_SYRINGE | INTRAVENOUS | Status: DC | PRN
  Administered 2021-03-13 (×2): 4 ug via INTRAVENOUS

## 2021-03-13 MED ORDER — DULOXETINE HCL 60 MG PO CPEP
60.0000 mg | ORAL_CAPSULE | Freq: Every day | ORAL | Status: DC
  Administered 2021-03-13: 60 mg via ORAL
  Filled 2021-03-13 (×2): qty 1

## 2021-03-13 MED ORDER — MORPHINE SULFATE (PF) 2 MG/ML IV SOLN
1.0000 mg | INTRAVENOUS | Status: DC | PRN
  Administered 2021-03-13 – 2021-03-14 (×6): 2 mg via INTRAVENOUS
  Filled 2021-03-13 (×6): qty 1

## 2021-03-13 MED ORDER — EPHEDRINE SULFATE 50 MG/ML IJ SOLN
INTRAMUSCULAR | Status: DC | PRN
  Administered 2021-03-13 (×3): 5 mg via INTRAVENOUS

## 2021-03-13 MED ORDER — EPHEDRINE 5 MG/ML INJ
INTRAVENOUS | Status: AC
  Filled 2021-03-13: qty 5

## 2021-03-13 MED ORDER — FENTANYL CITRATE (PF) 100 MCG/2ML IJ SOLN
25.0000 ug | INTRAMUSCULAR | Status: DC | PRN
  Administered 2021-03-13 (×2): 25 ug via INTRAVENOUS

## 2021-03-13 MED ORDER — DEXTROSE-NACL 5-0.45 % IV SOLN: INTRAVENOUS | Status: DC

## 2021-03-13 MED ORDER — CEFAZOLIN SODIUM-DEXTROSE 2-4 GM/100ML-% IV SOLN
INTRAVENOUS | Status: AC
  Filled 2021-03-13: qty 100

## 2021-03-13 MED ORDER — SIMETHICONE 80 MG PO CHEW
80.0000 mg | CHEWABLE_TABLET | Freq: Four times a day (QID) | ORAL | Status: DC | PRN
  Administered 2021-03-13 – 2021-03-14 (×3): 80 mg via ORAL
  Filled 2021-03-13 (×3): qty 1

## 2021-03-13 MED ORDER — LIDOCAINE HCL (PF) 2 % IJ SOLN
INTRAMUSCULAR | Status: AC
  Filled 2021-03-13: qty 5

## 2021-03-13 MED ORDER — MEPERIDINE HCL 25 MG/ML IJ SOLN: 6.2500 mg | INTRAMUSCULAR | Status: DC | PRN

## 2021-03-13 MED ORDER — ACETAMINOPHEN 10 MG/ML IV SOLN
INTRAVENOUS | Status: DC | PRN
  Administered 2021-03-13: 1000 mg via INTRAVENOUS

## 2021-03-13 MED ORDER — FENTANYL CITRATE (PF) 100 MCG/2ML IJ SOLN
INTRAMUSCULAR | Status: AC
  Filled 2021-03-13: qty 2

## 2021-03-13 MED ORDER — MIDAZOLAM HCL 2 MG/2ML IJ SOLN
INTRAMUSCULAR | Status: AC
  Filled 2021-03-13: qty 2

## 2021-03-13 MED ORDER — MENTHOL 3 MG MT LOZG
1.0000 | LOZENGE | OROMUCOSAL | Status: DC | PRN
  Filled 2021-03-13: qty 9

## 2021-03-13 MED ORDER — LACTATED RINGERS IV SOLN: INTRAVENOUS | Status: DC

## 2021-03-13 MED ORDER — ROCURONIUM BROMIDE 10 MG/ML (PF) SYRINGE
PREFILLED_SYRINGE | INTRAVENOUS | Status: AC
  Filled 2021-03-13: qty 10

## 2021-03-13 MED ORDER — OXYCODONE-ACETAMINOPHEN 5-325 MG PO TABS
1.0000 | ORAL_TABLET | ORAL | Status: DC | PRN
  Administered 2021-03-13 (×3): 2 via ORAL
  Administered 2021-03-14 (×2): 1 via ORAL
  Administered 2021-03-14: 2 via ORAL
  Filled 2021-03-13 (×6): qty 2

## 2021-03-13 MED ORDER — ONDANSETRON HCL 4 MG/2ML IJ SOLN: 4.0000 mg | Freq: Once | INTRAMUSCULAR | Status: DC | PRN

## 2021-03-13 MED ORDER — PHENYLEPHRINE HCL (PRESSORS) 10 MG/ML IV SOLN
INTRAVENOUS | Status: AC
  Filled 2021-03-13: qty 1

## 2021-03-13 MED ORDER — FENTANYL CITRATE (PF) 100 MCG/2ML IJ SOLN
INTRAMUSCULAR | Status: DC | PRN
  Administered 2021-03-13 (×2): 50 ug via INTRAVENOUS

## 2021-03-13 MED ORDER — SUGAMMADEX SODIUM 500 MG/5ML IV SOLN
INTRAVENOUS | Status: AC
  Filled 2021-03-13: qty 5

## 2021-03-13 MED ORDER — SUGAMMADEX SODIUM 500 MG/5ML IV SOLN
INTRAVENOUS | Status: DC | PRN
  Administered 2021-03-13: 300 mg via INTRAVENOUS

## 2021-03-13 MED ORDER — ONDANSETRON HCL 4 MG/2ML IJ SOLN
INTRAMUSCULAR | Status: AC
  Filled 2021-03-13: qty 2

## 2021-03-13 MED ORDER — ONDANSETRON HCL 4 MG/2ML IJ SOLN: 4.0000 mg | Freq: Four times a day (QID) | INTRAMUSCULAR | Status: DC | PRN

## 2021-03-13 MED ORDER — CEFAZOLIN SODIUM-DEXTROSE 2-4 GM/100ML-% IV SOLN
2.0000 g | INTRAVENOUS | Status: AC
  Administered 2021-03-13: 3 g via INTRAVENOUS

## 2021-03-13 MED ORDER — FENTANYL CITRATE (PF) 100 MCG/2ML IJ SOLN
INTRAMUSCULAR | Status: AC
  Administered 2021-03-13: 50 ug via INTRAVENOUS
  Filled 2021-03-13: qty 2

## 2021-03-13 MED ORDER — POVIDONE-IODINE 10 % EX SWAB
2.0000 "application " | Freq: Once | CUTANEOUS | Status: AC
  Administered 2021-03-13: 2 via TOPICAL

## 2021-03-13 MED ORDER — SUCCINYLCHOLINE CHLORIDE 200 MG/10ML IV SOSY
PREFILLED_SYRINGE | INTRAVENOUS | Status: AC
  Filled 2021-03-13: qty 10

## 2021-03-13 MED ORDER — METOPROLOL SUCCINATE ER 25 MG PO TB24
25.0000 mg | ORAL_TABLET | Freq: Every day | ORAL | Status: DC
  Administered 2021-03-14: 25 mg via ORAL
  Filled 2021-03-13: qty 1

## 2021-03-13 MED ORDER — CEFAZOLIN SODIUM 1 G IJ SOLR
INTRAMUSCULAR | Status: AC
  Filled 2021-03-13: qty 10

## 2021-03-13 MED ORDER — ONDANSETRON HCL 4 MG/2ML IJ SOLN
INTRAMUSCULAR | Status: DC | PRN
  Administered 2021-03-13: 4 mg via INTRAVENOUS

## 2021-03-13 MED ORDER — DEXMEDETOMIDINE (PRECEDEX) IN NS 20 MCG/5ML (4 MCG/ML) IV SYRINGE
PREFILLED_SYRINGE | INTRAVENOUS | Status: AC
  Filled 2021-03-13: qty 5

## 2021-03-13 MED ORDER — LIDOCAINE HCL (CARDIAC) PF 100 MG/5ML IV SOSY
PREFILLED_SYRINGE | INTRAVENOUS | Status: DC | PRN
  Administered 2021-03-13: 100 mg via INTRAVENOUS

## 2021-03-13 MED ORDER — ORAL CARE MOUTH RINSE: 15.0000 mL | Freq: Once | OROMUCOSAL | Status: AC

## 2021-03-13 MED ORDER — SODIUM CHLORIDE 0.9 % IR SOLN
Status: DC | PRN
  Administered 2021-03-13: 50 mL
  Administered 2021-03-13: 100 mL

## 2021-03-13 MED ORDER — PROPOFOL 10 MG/ML IV BOLUS
INTRAVENOUS | Status: DC | PRN
  Administered 2021-03-13: 160 mg via INTRAVENOUS
  Administered 2021-03-13: 20 mg via INTRAVENOUS

## 2021-03-13 MED ORDER — BUPIVACAINE HCL 0.5 % IJ SOLN
INTRAMUSCULAR | Status: DC | PRN
  Administered 2021-03-13: 26 mL

## 2021-03-13 MED ORDER — PROPOFOL 10 MG/ML IV BOLUS
INTRAVENOUS | Status: AC
  Filled 2021-03-13: qty 20

## 2021-03-13 SURGICAL SUPPLY — 54 items
BAG URINE DRAIN 2000ML AR STRL (UROLOGICAL SUPPLIES) ×3 IMPLANT
BASIN GRAD PLASTIC 32OZ STRL (MISCELLANEOUS) ×3 IMPLANT
BLADE SURG SZ11 CARB STEEL (BLADE) ×3 IMPLANT
CATH FOLEY 2WAY  5CC 16FR (CATHETERS) ×1
CATH URTH 16FR FL 2W BLN LF (CATHETERS) ×2 IMPLANT
CHLORAPREP W/TINT 26 (MISCELLANEOUS) ×3 IMPLANT
DERMABOND ADVANCED (GAUZE/BANDAGES/DRESSINGS) ×1
DERMABOND ADVANCED .7 DNX12 (GAUZE/BANDAGES/DRESSINGS) ×2 IMPLANT
DEVICE SUTURE ENDOST 10MM (ENDOMECHANICALS) ×3 IMPLANT
ELECT CAUTERY BLADE 6.4 (BLADE) ×3 IMPLANT
GAUZE 4X4 16PLY ~~LOC~~+RFID DBL (SPONGE) ×6 IMPLANT
GLOVE SURG ENC MOIS LTX SZ7 (GLOVE) ×12 IMPLANT
GLOVE SURG UNDER LTX SZ7.5 (GLOVE) ×9 IMPLANT
GOWN STRL REUS W/ TWL LRG LVL3 (GOWN DISPOSABLE) ×8 IMPLANT
GOWN STRL REUS W/ TWL XL LVL3 (GOWN DISPOSABLE) ×2 IMPLANT
GOWN STRL REUS W/TWL LRG LVL3 (GOWN DISPOSABLE) ×4
GOWN STRL REUS W/TWL XL LVL3 (GOWN DISPOSABLE) ×1
GRASPER SUT TROCAR 14GX15 (MISCELLANEOUS) ×3 IMPLANT
IRRIGATION STRYKERFLOW (MISCELLANEOUS) ×2 IMPLANT
IRRIGATOR STRYKERFLOW (MISCELLANEOUS) ×3
IV NS 1000ML (IV SOLUTION) ×2
IV NS 1000ML BAXH (IV SOLUTION) ×4 IMPLANT
KIT PINK PAD W/HEAD ARE REST (MISCELLANEOUS) ×3
KIT PINK PAD W/HEAD ARM REST (MISCELLANEOUS) ×2 IMPLANT
KITTNER LAPARASCOPIC 5X40 (MISCELLANEOUS) ×3 IMPLANT
LABEL OR SOLS (LABEL) ×3 IMPLANT
MANIFOLD NEPTUNE II (INSTRUMENTS) ×3 IMPLANT
MANIPULATOR VCARE LG CRV RETR (MISCELLANEOUS) ×3 IMPLANT
NS IRRIG 500ML POUR BTL (IV SOLUTION) ×3 IMPLANT
OCCLUDER COLPOPNEUMO (BALLOONS) ×3 IMPLANT
PACK GYN LAPAROSCOPIC (MISCELLANEOUS) ×3 IMPLANT
PAD PREP 24X41 OB/GYN DISP (PERSONAL CARE ITEMS) ×3 IMPLANT
PENCIL ELECTRO HAND CTR (MISCELLANEOUS) ×3 IMPLANT
SCISSORS METZENBAUM CVD 33 (INSTRUMENTS) ×3 IMPLANT
SCRUB EXIDINE 4% CHG 4OZ (MISCELLANEOUS) ×3 IMPLANT
SET CYSTO W/LG BORE CLAMP LF (SET/KITS/TRAYS/PACK) ×3 IMPLANT
SHEARS HARMONIC ACE PLUS 36CM (ENDOMECHANICALS) ×3 IMPLANT
SLEEVE ENDOPATH XCEL 5M (ENDOMECHANICALS) ×6 IMPLANT
SOL ANTI-FOG 6CC FOG-OUT (MISCELLANEOUS) ×2 IMPLANT
SOL FOG-OUT ANTI-FOG 6CC (MISCELLANEOUS) ×1
SUT ENDO VLOC 180-0-8IN (SUTURE) ×3 IMPLANT
SUT MNCRL 4-0 (SUTURE) ×2
SUT MNCRL 4-0 27XMFL (SUTURE) ×4
SUT VIC AB 0 CT1 36 (SUTURE) ×6 IMPLANT
SUTURE MNCRL 4-0 27XMF (SUTURE) ×4 IMPLANT
SYR 10ML LL (SYRINGE) ×3 IMPLANT
SYR 50ML LL SCALE MARK (SYRINGE) ×3 IMPLANT
SYR TOOMEY IRRIG 70ML (MISCELLANEOUS) ×3
SYRINGE TOOMEY IRRIG 70ML (MISCELLANEOUS) ×2 IMPLANT
TROCAR ENDO BLADELESS 11MM (ENDOMECHANICALS) ×3 IMPLANT
TROCAR XCEL BLUNT TIP 100MML (ENDOMECHANICALS) ×3 IMPLANT
TROCAR XCEL NON-BLD 5MMX100MML (ENDOMECHANICALS) ×3 IMPLANT
TUBING EVAC SMOKE HEATED PNEUM (TUBING) ×3 IMPLANT
WATER STERILE IRR 500ML POUR (IV SOLUTION) ×3 IMPLANT

## 2021-03-13 NOTE — H&P (View-Only) (Signed)
Obstetrics & Gynecology Surgery H&P    Chief Complaint: Scheduled Surgery   History of Present Illness: Patient is a 45 y.o. G3P3 presenting for scheduled TLH, BS, and cystoscopy, for the treatment or further evaluation of pelvis pain with imaging findings suggestive of postablation tubal sterilization syndrome   Prior Treatments prior to proceeding with surgery include: patient had endometrial ablation   Preoperative Pap: 01/04/2020 Results:  ASCUS HPV negative   Preoperative Endometrial biopsy: [id number] findings: pathology benign proliferative endometrium Preoperative CT 03/01/2021 physiologic 2.3cm right ovarian cyst, bilaterally enlarged lymph nodes measuring up to 1.1cm, enlarged heterogenous uterus, surgically absent left ovary,  and and TVUS 02/16/2021 anteverted uterus with two  intramural fibroids 3 x 2.7 x 2.4cm and 3.1 x 2.3 x 2.7cm and a hypodense cystic fluid collection measuring 1.8 x 1.3 x 1.1cm noted at the fundus of the uterus.  EMS indistinct in the setting of prior ablation, right ovarian hemorrhagic cyst measuing 2.7 x 3.7 x 2.5cm.  02/20/2020 Patient underwent Novasure endometrial ablation following failed trial of Mirena IUD.    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   Review of Systems:10 point review of systems  Past Medical History:  Patient Active Problem List   Diagnosis Date Noted   Abnormal uterine bleeding (AUB)     Past Surgical History:  Past Surgical History:  Procedure Laterality Date   CESAREAN SECTION  x3   felt the cesearean during the first c-section and the second time anesthesia wore off   CHOLECYSTECTOMY, [place] N/A 02/20/2020   Procedure: DILATATION & CURETTAGE/HYSTEROSCOPY WITH NOVASURE ABLATION;  Surgeon: Malachy Mood, MD;  Location: [facility] ORS;  Service: Gynecology;  Laterality: N/A;   GASTRIC BYPASS  2015   IUD REMOVAL N/A 02/20/2020   Procedure: MIRENA INTRAUTERINE DEVICE (IUD) REMOVAL;  Surgeon: Malachy Mood, MD;  Location: [facility] ORS;  Service: Gynecology;  Laterality: N/A;   left oopherectomy     TONSILLECTOMY     torn Rotator cuff Right     Family History:  Family History  Problem Relation Age of Onset   Diabetes Mother    Kidney cancer Father    Diabetes Maternal Grandfather    Colon cancer Paternal Grandfather     Social History:  Social History   Socioeconomic History   Marital status: Divorced    Spouse name: Not on file   Number of children: Not on file   Years of education: Not on file   Highest education level: Not on file  Occupational History   Not on file  Tobacco Use   Smoking status: Never   Smokeless tobacco: Never  Vaping Use   Vaping Use: Never used  Substance and Sexual Activity   Alcohol use: Never   Drug use: Never   Sexual activity: Not Currently    Birth control/protection: Surgical    Comment: Bilateral Tubal Ligation  Other Topics Concern   Not on file  Social History Narrative   Not on file   Social Determinants of Health   Financial Resource Strain: Not on file  Food Insecurity: Not on file  Transportation Needs: Not on file  Physical Activity: Not on file  Stress: Not on file  Social Connections: Not on file  Intimate Partner Violence: Not on file    Allergies:  Allergies  Allergen Reactions   Lisinopril Cough   Tape Rash    Medications: Prior to Admission medications   Medication Sig Start Date End Date Taking? Authorizing Provider  amLODipine (NORVASC) 10 MG tablet Take 10 mg by mouth at bedtime.    Yes [provider]  ammonium lactate (AMLACTIN) 12 % cream Apply topically 2 (two) times  daily. 11/04/20  Yes [provider]  amphetamine-dextroamphetamine (ADDERALL) 15 MG tablet Take 15 mg by mouth 2 (two) times daily. 01/09/21  Yes [provider]  Ascorbic Acid (VITAMIN C) 1000 MG tablet Take 1,000 mg by mouth once a week.   Yes [provider]  Calcium Citrate-Vitamin D (CALCIUM + D PO) Take 1 tablet by mouth daily.   Yes [provider]  DULoxetine (CYMBALTA) 60 MG capsule Take 60 mg by mouth at bedtime. 12/09/20  Yes [provider]  hydrochlorothiazide (HYDRODIURIL) 25 MG tablet Take 25 mg by mouth daily.  07/30/19  Yes [provider]  HYDROcodone-acetaminophen (NORCO/VICODIN) 5-325 MG tablet Take 1 tablet by mouth every 6 (six) hours as needed. 03/07/21   Malachy Mood, MD  hydrocortisone 2.5 % cream Apply 1 application topically daily.   Yes [provider]  losartan (COZAAR) 100 MG tablet Take 100 mg by mouth daily. 07/30/19  Yes [provider]  metoprolol succinate (TOPROL-XL) 25 MG 24 hr tablet Take 25 mg by mouth daily. 07/30/19  Yes [provider]  oxyCODONE (OXY IR/ROXICODONE) 5 MG immediate release tablet Take 5 mg by mouth every 4 (four) hours as needed for severe pain.   Yes [provider]  pantoprazole (PROTONIX) 20 MG tablet Take 20 mg by mouth daily before breakfast.  09/29/19  Yes [provider]  phentermine 30 MG capsule Take 30 mg by mouth daily.   Yes [provider]  topiramate (TOPAMAX) 25 MG tablet Take 25 mg by mouth daily. 12/16/20  Yes [provider]  acetaminophen (TYLENOL) 500 MG tablet Take 1,000 mg by mouth every 6 (six) hours as needed (for pain.).    [provider]  Cholecalciferol (VITAMIN D3) 50 MCG (2000 UT) TABS Take 4,000 Units by mouth daily.    [provider]  gabapentin (NEURONTIN) 300 MG capsule Take 300 mg by mouth at bedtime. 12/26/20   [provider]  ibuprofen (ADVIL) 800 MG tablet Take 800 mg  by mouth every 8 (eight) hours as needed for moderate pain.    [provider]  meloxicam (MOBIC) 15 MG tablet Take 15 mg by mouth daily. 12/04/20   [provider]  Semaglutide,0.25 or 0.'5MG'$ /DOS, (OZEMPIC, 0.25 OR 0.5 MG/DOSE,) 2 MG/1.5ML SOPN Inject 0.25 mg as directed once a week.    [provider]    Physical Exam Vitals: Blood pressure 138/86, height 5' 7.5" (1.715 m), weight 279 lb (126.6 kg), last menstrual period 02/20/2020. Body mass index is 43.05 kg/m.  General: NAD HEENT: normocephalic, anicteric Pulmonary: No increased work of breathing Cardiovascular: RRR, distal pulses 2+ Abdomen: soft, non-tender, non-distended.  Prior midline abdominal scar as well as port site incision  Genitourinary: deferred Extremities: no edema, erythema, or tenderness Neurologic: Grossly intact Psychiatric: mood appropriate, affect full  Imaging No results found.  Assessment: 45 y.o. G3P3 presenting for scheduled TLH, BS, cystoscopy  Plan: 1) Patient opts for definitive surgical management via hysterectomy. The risks of surgery were discussed in  detail with the patient including but not limited to: bleeding which may require transfusion or reoperation; infection which may require antibiotics; injury to bowel, bladder, ureters or other surrounding organs (With a literature reported rate of urinary tract injury of 1% quoted); need for additional procedures including laparotomy; thromboembolic phenomenon, incisional problems and other postoperative/anesthesia complications.  Patient was also advised that recovery procedure generally involves an overnight stay; and the  expected recovery time after a hysterectomy being in the range of 6-8 weeks.  Likelihood of success in alleviating the patient's symptoms was discussed.  While definitive in regards to issues with menstural bleeding, pelvic pain if present preoperatively may continue and in fact worsen postoperatively.  She is  aware that the procedure will render her unable to pursue childbearing in the future.   She was told that she will be contacted by our surgical scheduler regarding the time and date of her surgery; routine preoperative instructions of having nothing to eat or drink after midnight on the day prior to surgery and also coming to the hospital 1.5 hours prior to her time of surgery were also emphasized.  She was told she may be called for a preoperative appointment about a week prior to surgery and will be given further preoperative instructions at that visit.  Routine postoperative instructions will be reviewed with the patient and her family in detail after surgery. Printed patient education handouts about the procedure was given to the patient to review at home.   2) Routine postoperative instructions were reviewed with the patient and her family in detail today including the expected length of recovery and likely postoperative course.  The patient concurred with the proposed plan, giving informed written consent for the surgery today.  Patient instructed on the importance of being NPO after midnight prior to her procedure.  If warranted preoperative prophylactic antibiotics and SCDs ordered on call to the OR to meet SCIP guidelines and adhere to recommendation laid forth in [place] Number 104 May 2009  "Antibiotic Prophylaxis for Gynecologic Procedures".     Malachy Mood, MD, [facility], [facility] 03/12/2021, 3:22 PM

## 2021-03-13 NOTE — Op Note (Signed)
Preoperative Diagnosis: 1) 45 y.o.  with chronic pelvic pain 2) Postablation tubal sterilization syndrome 3) Uterine fibroids  Postoperative Diagnosis: 1) 45 y.o.  with chronic pelvic pain 2) Postablation tubal sterilization syndrome 3) Uterine fibroids 4) Dense adhesive disease of the uterus to the left pelvis sidewall, and bladder to anterior uterus  Operation Performed: Total laparoscopic hysterectomy, extensive lysis of adhesions >548mn, and cystoscopy  Indication: Patient with several recent episodes of acute abdominal pain, ER imaging consistent with possible PATSS  Surgeon: AMalachy Mood MD  Assistant: CAdrian Prows MD this surgery required a high level surgical assistant with none other readily available  Anesthesia: General  Preoperative Antibiotics: 3g Ancef  Estimated Blood Loss: 100 mL  IV Fluids: 1L  Urine Output:: 8048m Drains or Tubes: Foley to gravity drainage  Implants: none  Specimens Removed: Uterus and cervix  Complications: none  Intraoperative Findings: There was dense adhesive disease involving the left uterus and pelvic sidewall.  The left ovary and tube were surgically absent.  With in the left sidewall adhesions were several inclusion cysts containing clear straw colored fluid.  The uterus was globally enlarged and irregular in contour consistent with previously imaged fibroids. The right ovary appeared normal.  Appendix, liver edge norma.  No significant adhesive disease noted in upper abdomen.  Cystoscopy revealed brisk efflux of urine from both ureteral orifices with intact bladder dome  Patient Condition: stable  Procedure in Detail:  Patient was taken to the operating room where she was administered general anesthesia.  She was positioned in the dorsal lithotomy position utilizing Allen stirups, prepped and draped in the usual sterile fashion.  Prior to proceeding with procedure a time out was performed.  Attention was turned to the  patient's pelvis.  An indwelling foley catheter was placed to decompress the patient's bladder.  An operative speculum was placed to allow visualization of the cervix.  The anterior lip of the cervix was grasped with a single tooth tenaculum, and a large V-care uterine manipulator was placed to allow manipulation of the uterus.  The operative speculum and single tooth tenaculum were then removed.  Attention was turned to the patient's abdomen.  The umbilicus was infiltrated with 1% Sensorcaine, before making a stab incision using an 11 blade scalpel supraumbilical.  The subcutanous tissue was dissected down to the fascia but dense scar tissue was noted overlying the fascia.  Decision was made to switch to palmer's point entry using  a [id number] trocar  after orogastric tube placement by anesthesia.  Once peritoneal entry had been achieved, insufflation was started and pneumoperitoneum established at a pressure of 148m14m    One left and one right lower quadrant site were then injected with 1% Sensorcaine and a stab incision was made using an 11 blade scalpel.  Two additional 48mm 62mel trocars were placed through these incisions under direct visualization. An 11mm 18ml trocar was placed through the previously made umbilical incision.  General inspection of the abdomen revealed the above noted findings.   Some filmy adhesions tethering the sigmoid colon to the left cornua were mobilized using a combination of blunt dissection the Harmonic scalpel. A remnant of tube with small hydrosalpinx was identified and dissected free.  Multiple filmy adhesions of the colon to the posterior uterus were taken down using blunt dissection and the harmonic scalpel.  There were several peritoneal inclusion cysts containing clear straw colored fluid.  There was no clear plain to proceed further noted anterior with concern for involvement of  the bladder.  The bladder was hydro distended with normal saline confirming close proximity of  the bladder Attention was turned to the right side of the uterus.  The right utero ovarian ligament was identified ligated and transected using the Harmonic scalpel by Dr. Gilman Schmidt. The round ligament was then likewise ligated and transected.  The anterior leaf of the broad ligament was dissected down to the level of the internal cervical os and a bladder flap was started.  The posterior leaf of the broad ligament was dissected down to the utero-sacral ligament.  The uterine artery was skeletonized before being ligated and transected using the Harmonic scalpel with cephelad pressure applied to the V-care device to assure lateralization of the ureter.  A bite was then taken with Harmonic medial to transected portio of uterine artery to further lateralize the ureter and vessel off the V-care cup.    The bladder flap was used to help develop a plane dissecting the bladder of the left side of there lower uterine segment.  As the this plane was further developed what appeared to be a densely scarred round ligament was identified.  This was transected and allowed further disection of the left pelvic side-wall down to the level of there uterine artery.  This was skeletonized, cauterized and transected.  An anterior colpotomy was scores and carried around in a clockwise fashion to free the specimen, which was then removed vaginally.  Inspection revealed all pedicles to be hemostatic before proceed with vaginal closure.  The vaginal cuff was closed using a running 0 V-loc suture and and endo stitch device.  Pelvis was irrigated with all pedicles noted to be hemostatic.  The umbilical [id number] Excel trocar was removed and closed using a 0 Vicryl Carther-Thomspon.  Pneumoperitoneum was evacuated and trocars were removed.  All trocar sites were then dressed with surgical skin glue.    The indwelling foley catheter was removed.  Cystoscopy was performed noting and intact bladder dome as well as brisk efflux of urine from bother  ureteral orifices.  The cystoscopy was removed and the indwelling foley catheter was replaced.  Sponge needle and instrument counts were correct time two.  The patient tolerated the procedure well and was taken to the recovery room in stable condition.

## 2021-03-13 NOTE — Transfer of Care (Signed)
Immediate Anesthesia Transfer of Care Note  Patient: Sharon Zimmerman  Procedure(s) Performed: TOTAL LAPAROSCOPIC HYSTERECTOMY (Bilateral) CYSTOSCOPY  Patient Location: PACU  Anesthesia Type:General  Level of Consciousness: awake, alert  and oriented  Airway & Oxygen Therapy: Patient Spontanous Breathing and Patient connected to face mask oxygen  Post-op Assessment: Report given to RN and Post -op Vital signs reviewed and stable  Post vital signs: Reviewed and stable  Last Vitals:  Vitals Value Taken Time  BP 118/83 03/13/21 1106  Temp    Pulse 94 03/13/21 1109  Resp 18 03/13/21 1109  SpO2 100 % 03/13/21 1109  Vitals shown include unvalidated device data.  Last Pain:  Vitals:   03/13/21 0633  TempSrc: Temporal  PainSc: 0-No pain         Complications: No notable events documented.

## 2021-03-13 NOTE — Anesthesia Preprocedure Evaluation (Addendum)
Anesthesia Evaluation  Patient identified by MRN, date of birth, ID bandGeneral Assessment Comment:Patient somnolent, easily arousable and becomes alert  Reviewed: Allergy & Precautions, NPO status , Patient's Chart, lab work & pertinent test results  History of Anesthesia Complications Negative for: history of anesthetic complications  Airway Mallampati: II  TM Distance: >3 FB Neck ROM: Full    Dental  (+) Chipped, Dental Advisory Given   Pulmonary sleep apnea and Continuous Positive Airway Pressure Ventilation , neg COPD, Patient abstained from smoking.Not current smoker,    Pulmonary exam normal breath sounds clear to auscultation       Cardiovascular Exercise Tolerance: Good METShypertension, Pt. 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 OSA  Reproductive/Obstetrics                            Anesthesia Physical  Anesthesia Plan  ASA: 3  Anesthesia Plan: General   Post-op Pain Management:    Induction: Intravenous and Rapid sequence  PONV Risk Score and Plan: 4 or greater and Ondansetron, Dexamethasone, Midazolam and Propofol infusion  Airway Management Planned: Oral ETT and Video Laryngoscope Planned  Additional Equipment: None  Intra-op Plan:   Post-operative Plan: Extubation in OR  Informed  Consent: I have reviewed the patients History and Physical, chart, labs and discussed the procedure including the risks, benefits and alternatives for the proposed anesthesia with the patient or authorized representative who has indicated his/her understanding and acceptance.     Dental advisory given  Plan Discussed with: CRNA, Surgeon and Anesthesiologist  Anesthesia Plan Comments: (Discussed risks of anesthesia with patient, including PONV, sore throat, lip/dental damage. Rare risks discussed as well, such as cardiorespiratory and neurological sequelae. Patient understands. Patient informed about increased incidence of above perioperative risk due to high BMI. Patient understands. )       Anesthesia Quick Evaluation

## 2021-03-13 NOTE — Progress Notes (Signed)
Obstetrics & Gynecology Surgery H&P    Chief Complaint: Scheduled Surgery   History of Present Illness: Patient is a 45 y.o. G3P3 presenting for scheduled TLH, BS, and cystoscopy, for the treatment or further evaluation of pelvis pain with imaging findings suggestive of postablation tubal sterilization syndrome   Prior Treatments prior to proceeding with surgery include: patient had endometrial ablation   Preoperative Pap: 01/04/2020 Results:  ASCUS HPV negative   Preoperative Endometrial biopsy: 4/2122021 findings: pathology benign proliferative endometrium Preoperative CT 03/01/2021 physiologic 2.3cm right ovarian cyst, bilaterally enlarged lymph nodes measuring up to 1.1cm, enlarged heterogenous uterus, surgically absent left ovary,  and and TVUS 02/16/2021 anteverted uterus with two  intramural fibroids 3 x 2.7 x 2.4cm and 3.1 x 2.3 x 2.7cm and a hypodense cystic fluid collection measuring 1.8 x 1.3 x 1.1cm noted at the fundus of the uterus.  EMS indistinct in the setting of prior ablation, right ovarian hemorrhagic cyst measuing 2.7 x 3.7 x 2.5cm.  02/20/2020 Patient underwent Novasure endometrial ablation following failed trial of Mirena IUD.    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   Review of Systems:10 point review of systems  Past Medical History:  Patient Active Problem List   Diagnosis Date Noted   Abnormal uterine bleeding (AUB)     Past Surgical History:  Past Surgical History:  Procedure Laterality Date   CESAREAN SECTION  x3   felt the cesearean during the first c-section and the second time anesthesia wore off   CHOLECYSTECTOMY, [place] N/A 02/20/2020   Procedure: DILATATION & CURETTAGE/HYSTEROSCOPY WITH NOVASURE ABLATION;  Surgeon: Malachy Mood, MD;  Location: [facility] ORS;  Service: Gynecology;  Laterality: N/A;   GASTRIC BYPASS  2015   IUD REMOVAL N/A 02/20/2020   Procedure: MIRENA INTRAUTERINE DEVICE (IUD) REMOVAL;  Surgeon: Malachy Mood, MD;  Location: [facility] ORS;  Service: Gynecology;  Laterality: N/A;   left oopherectomy     TONSILLECTOMY     torn Rotator cuff Right     Family History:  Family History  Problem Relation Age of Onset   Diabetes Mother    Kidney cancer Father    Diabetes Maternal Grandfather    Colon cancer Paternal Grandfather     Social History:  Social History   Socioeconomic History   Marital status: Divorced    Spouse name: Not on file   Number of children: Not on file   Years of education: Not on file   Highest education level: Not on file  Occupational History   Not on file  Tobacco Use   Smoking status: Never   Smokeless tobacco: Never  Vaping Use   Vaping Use: Never used  Substance and Sexual Activity   Alcohol use: Never   Drug use: Never   Sexual activity: Not Currently    Birth control/protection: Surgical    Comment: Bilateral Tubal Ligation  Other Topics Concern   Not on file  Social History Narrative   Not on file   Social Determinants of Health   Financial Resource Strain: Not on file  Food Insecurity: Not on file  Transportation Needs: Not on file  Physical Activity: Not on file  Stress: Not on file  Social Connections: Not on file  Intimate Partner Violence: Not on file    Allergies:  Allergies  Allergen Reactions   Lisinopril Cough   Tape Rash    Medications: Prior to Admission medications   Medication Sig Start Date End Date Taking? Authorizing Provider  amLODipine (NORVASC) 10 MG tablet Take 10 mg by mouth at bedtime.    Yes [provider]  ammonium lactate (AMLACTIN) 12 % cream Apply topically 2 (two) times  daily. 11/04/20  Yes [provider]  amphetamine-dextroamphetamine (ADDERALL) 15 MG tablet Take 15 mg by mouth 2 (two) times daily. 01/09/21  Yes [provider]  Ascorbic Acid (VITAMIN C) 1000 MG tablet Take 1,000 mg by mouth once a week.   Yes [provider]  Calcium Citrate-Vitamin D (CALCIUM + D PO) Take 1 tablet by mouth daily.   Yes [provider]  DULoxetine (CYMBALTA) 60 MG capsule Take 60 mg by mouth at bedtime. 12/09/20  Yes [provider]  hydrochlorothiazide (HYDRODIURIL) 25 MG tablet Take 25 mg by mouth daily.  07/30/19  Yes [provider]  HYDROcodone-acetaminophen (NORCO/VICODIN) 5-325 MG tablet Take 1 tablet by mouth every 6 (six) hours as needed. 03/07/21   Malachy Mood, MD  hydrocortisone 2.5 % cream Apply 1 application topically daily.   Yes [provider]  losartan (COZAAR) 100 MG tablet Take 100 mg by mouth daily. 07/30/19  Yes [provider]  metoprolol succinate (TOPROL-XL) 25 MG 24 hr tablet Take 25 mg by mouth daily. 07/30/19  Yes [provider]  oxyCODONE (OXY IR/ROXICODONE) 5 MG immediate release tablet Take 5 mg by mouth every 4 (four) hours as needed for severe pain.   Yes [provider]  pantoprazole (PROTONIX) 20 MG tablet Take 20 mg by mouth daily before breakfast.  09/29/19  Yes [provider]  phentermine 30 MG capsule Take 30 mg by mouth daily.   Yes [provider]  topiramate (TOPAMAX) 25 MG tablet Take 25 mg by mouth daily. 12/16/20  Yes [provider]  acetaminophen (TYLENOL) 500 MG tablet Take 1,000 mg by mouth every 6 (six) hours as needed (for pain.).    [provider]  Cholecalciferol (VITAMIN D3) 50 MCG (2000 UT) TABS Take 4,000 Units by mouth daily.    [provider]  gabapentin (NEURONTIN) 300 MG capsule Take 300 mg by mouth at bedtime. 12/26/20   [provider]  ibuprofen (ADVIL) 800 MG tablet Take 800 mg  by mouth every 8 (eight) hours as needed for moderate pain.    [provider]  meloxicam (MOBIC) 15 MG tablet Take 15 mg by mouth daily. 12/04/20   [provider]  Semaglutide,0.25 or 0.'5MG'$ /DOS, (OZEMPIC, 0.25 OR 0.5 MG/DOSE,) 2 MG/1.5ML SOPN Inject 0.25 mg as directed once a week.    [provider]    Physical Exam Vitals: Blood pressure 138/86, height 5' 7.5" (1.715 m), weight 279 lb (126.6 kg), last menstrual period 02/20/2020. Body mass index is 43.05 kg/m.  General: NAD HEENT: normocephalic, anicteric Pulmonary: No increased work of breathing Cardiovascular: RRR, distal pulses 2+ Abdomen: soft, non-tender, non-distended.  Prior midline abdominal scar as well as port site incision  Genitourinary: deferred Extremities: no edema, erythema, or tenderness Neurologic: Grossly intact Psychiatric: mood appropriate, affect full  Imaging No results found.  Assessment: 45 y.o. G3P3 presenting for scheduled TLH, BS, cystoscopy  Plan: 1) Patient opts for definitive surgical management via hysterectomy. The risks of surgery were discussed in  detail with the patient including but not limited to: bleeding which may require transfusion or reoperation; infection which may require antibiotics; injury to bowel, bladder, ureters or other surrounding organs (With a literature reported rate of urinary tract injury of 1% quoted); need for additional procedures including laparotomy; thromboembolic phenomenon, incisional problems and other postoperative/anesthesia complications.  Patient was also advised that recovery procedure generally involves an overnight stay; and the  expected recovery time after a hysterectomy being in the range of 6-8 weeks.  Likelihood of success in alleviating the patient's symptoms was discussed.  While definitive in regards to issues with menstural bleeding, pelvic pain if present preoperatively may continue and in fact worsen postoperatively.  She is  aware that the procedure will render her unable to pursue childbearing in the future.   She was told that she will be contacted by our surgical scheduler regarding the time and date of her surgery; routine preoperative instructions of having nothing to eat or drink after midnight on the day prior to surgery and also coming to the hospital 1.5 hours prior to her time of surgery were also emphasized.  She was told she may be called for a preoperative appointment about a week prior to surgery and will be given further preoperative instructions at that visit.  Routine postoperative instructions will be reviewed with the patient and her family in detail after surgery. Printed patient education handouts about the procedure was given to the patient to review at home.   2) Routine postoperative instructions were reviewed with the patient and her family in detail today including the expected length of recovery and likely postoperative course.  The patient concurred with the proposed plan, giving informed written consent for the surgery today.  Patient instructed on the importance of being NPO after midnight prior to her procedure.  If warranted preoperative prophylactic antibiotics and SCDs ordered on call to the OR to meet SCIP guidelines and adhere to recommendation laid forth in [place] Number 104 May 2009  "Antibiotic Prophylaxis for Gynecologic Procedures".     Malachy Mood, MD, [facility], [facility] 03/12/2021, 3:22 PM

## 2021-03-13 NOTE — Anesthesia Postprocedure Evaluation (Signed)
Anesthesia Post Note  Patient: Sharon Zimmerman  Procedure(s) Performed: TOTAL LAPAROSCOPIC HYSTERECTOMY (Bilateral) CYSTOSCOPY  Patient location during evaluation: PACU Anesthesia Type: General Level of consciousness: awake and alert, awake and oriented Pain management: pain level controlled Vital Signs Assessment: post-procedure vital signs reviewed and stable Respiratory status: spontaneous breathing, nonlabored ventilation and respiratory function stable Cardiovascular status: blood pressure returned to baseline and stable Postop Assessment: no apparent nausea or vomiting Anesthetic complications: no   No notable events documented.   Last Vitals:  Vitals:   03/13/21 1145 03/13/21 1153  BP: 122/87   Pulse: 80 82  Resp: 15 14  Temp:  (!) 36.3 C  SpO2: (!) 89% 98%    Last Pain:  Vitals:   03/13/21 1153  TempSrc:   PainSc: 5                  Phill Mutter

## 2021-03-13 NOTE — Anesthesia Procedure Notes (Signed)
Procedure Name: Intubation Date/Time: 03/13/2021 7:38 AM Performed by: Tollie Eth, CRNA Pre-anesthesia Checklist: Patient identified, Patient being monitored, Timeout performed, Emergency Drugs available and Suction available Patient Re-evaluated:Patient Re-evaluated prior to induction Oxygen Delivery Method: Circle system utilized Preoxygenation: Pre-oxygenation with 100% oxygen Induction Type: IV induction Ventilation: Mask ventilation without difficulty Laryngoscope Size: 3 and McGraph Grade View: Grade I Tube type: Oral Tube size: 7.0 mm Number of attempts: 1 Airway Equipment and Method: Stylet and Video-laryngoscopy Placement Confirmation: ETT inserted through vocal cords under direct vision, positive ETCO2 and breath sounds checked- equal and bilateral Secured at: 21 cm Tube secured with: Tape Dental Injury: Teeth and Oropharynx as per pre-operative assessment

## 2021-03-13 NOTE — Interval H&P Note (Signed)
History and Physical Interval Note:  03/13/2021 7:27 AM  Sharon Zimmerman  has presented today for surgery, with the diagnosis of Abdominal pain hematometra.  The various methods of treatment have been discussed with the patient and family. After consideration of risks, benefits and other options for treatment, the patient has consented to  Procedure(s): TOTAL LAPAROSCOPIC HYSTERECTOMY WITH SALPINGECTOMY (Bilateral) as a surgical intervention.  The patient's history has been reviewed, patient examined, no change in status, stable for surgery.  I have reviewed the patient's chart and labs.  Questions were answered to the patient's satisfaction.     Malachy Mood

## 2021-03-14 ENCOUNTER — Encounter: Payer: Self-pay | Admitting: Obstetrics and Gynecology

## 2021-03-14 DIAGNOSIS — D259 Leiomyoma of uterus, unspecified: Secondary | ICD-10-CM | POA: Diagnosis not present

## 2021-03-14 LAB — CBC
HCT: 36 % (ref 36.0–46.0)
Hemoglobin: 11.8 g/dL — ABNORMAL LOW (ref 12.0–15.0)
MCH: 27 pg (ref 26.0–34.0)
MCHC: 32.8 g/dL (ref 30.0–36.0)
MCV: 82.4 fL (ref 80.0–100.0)
Platelets: 338 10*3/uL (ref 150–400)
RBC: 4.37 MIL/uL (ref 3.87–5.11)
RDW: 13.7 % (ref 11.5–15.5)
WBC: 8.2 10*3/uL (ref 4.0–10.5)
nRBC: 0 % (ref 0.0–0.2)

## 2021-03-14 LAB — BASIC METABOLIC PANEL
Anion gap: 6 (ref 5–15)
BUN: 11 mg/dL (ref 6–20)
CO2: 28 mmol/L (ref 22–32)
Calcium: 8.4 mg/dL — ABNORMAL LOW (ref 8.9–10.3)
Chloride: 100 mmol/L (ref 98–111)
Creatinine, Ser: 0.84 mg/dL (ref 0.44–1.00)
GFR, Estimated: >60 mL/min (ref >60)
Glucose, Bld: 113 mg/dL — ABNORMAL HIGH (ref 70–99)
Potassium: 2.9 mmol/L — ABNORMAL LOW (ref 3.5–5.1)
Sodium: 134 mmol/L — ABNORMAL LOW (ref 135–145)

## 2021-03-14 LAB — SURGICAL PATHOLOGY

## 2021-03-14 MED ORDER — OXYCODONE-ACETAMINOPHEN 5-325 MG PO TABS: 1.0000 | ORAL_TABLET | ORAL | 0 refills | Status: DC | PRN

## 2021-03-14 MED ORDER — POTASSIUM CHLORIDE 20 MEQ PO PACK
20.0000 meq | PACK | Freq: Once | ORAL | Status: AC
  Administered 2021-03-14: 20 meq via ORAL
  Filled 2021-03-14: qty 1

## 2021-03-14 NOTE — Progress Notes (Signed)
Patient discharged home with family.  Discharge instructions, when to follow up, and prescriptions reviewed with patient.  Potassium replaced prior to discharge, encouraged patient to follow up  with her PCP as she states this has happened before. Patient verbalized understanding. Patient will be escorted out by auxiliary.

## 2021-03-21 ENCOUNTER — Encounter: Payer: Self-pay | Admitting: Obstetrics and Gynecology

## 2021-03-21 ENCOUNTER — Telehealth: Payer: Self-pay

## 2021-03-21 ENCOUNTER — Other Ambulatory Visit: Payer: Self-pay

## 2021-03-21 ENCOUNTER — Ambulatory Visit (INDEPENDENT_AMBULATORY_CARE_PROVIDER_SITE_OTHER): Payer: 59 | Admitting: Obstetrics and Gynecology

## 2021-03-21 VITALS — BP 120/70 | Wt 279.0 lb

## 2021-03-21 DIAGNOSIS — Z09 Encounter for follow-up examination after completed treatment for conditions other than malignant neoplasm: Secondary | ICD-10-CM

## 2021-03-21 MED ORDER — OXYCODONE-ACETAMINOPHEN 5-325 MG PO TABS: 1.0000 | ORAL_TABLET | ORAL | 0 refills | Status: AC | PRN

## 2021-03-21 NOTE — Telephone Encounter (Signed)
Patient seen today for post op appointment. Patient is wanting to know the status of her FMLA. Please advise. Patient aware your out of the office today.

## 2021-03-21 NOTE — Telephone Encounter (Signed)
Sent pt a message about FMLA

## 2021-03-21 NOTE — Progress Notes (Signed)
Postoperative Follow-up Patient presents post op from [facility], cystoscopy 1weeks ago for pelvic pain.  Subjective: Patient reports some improvement in her preop symptoms. Eating a regular diet without difficulty. Pain is not well controlled.  Medications being used: narcotic analgesics including oxycodone/acetaminophen (Percocet, Tylox).  Activity: normal activities of daily living.  Objective: Blood pressure 120/70, weight 279 lb (126.6 kg).  General: NAD Pulmonary: no increased work of breathing Abdomen: soft, non-tender, non-distended, incision(s) D/C/I Extremities: no edema Neurologic: normal gait    Admission on 03/13/2021, Discharged on 03/14/2021  Component Date Value Ref Range Status   ABO/RH(D) 03/13/2021    Final                   Value:B POS Performed at [facility], [address] [place], [place] [address]    Preg Test, Ur 03/13/2021 NEGATIVE  NEGATIVE Final   Comment:        THE SENSITIVITY OF THIS METHODOLOGY IS >24 mIU/mL    SURGICAL PATHOLOGY 03/13/2021    Final-Edited                   Value:SURGICAL PATHOLOGY CASE: [id number] PATIENT: Evelene Croon Surgical Pathology Report     Specimen Submitted: A. Uterus, cervix  Clinical History: Abdominal pain, hematometra      DIAGNOSIS: A. UTERUS WITH CERVIX; TOTAL HYSTERECTOMY: - CERVIX WITH SQUAMOUS AND CILIATED METAPLASIA. - PROLIFERATIVE ENDOMETRIUM WITH CHANGES CONSISTENT WITH PRIOR ABLATION. - MYOMETRIUM WITH LEIOMYOMATA AND ADENOMYOSIS. 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 - NEGATIVE FOR ATYPIA AND MALIGNANCY.  GROSS DESCRIPTION: A. Labeled: Uterus and cervix Received: Formalin Collection time: 10:10 AM on 03/13/2021 Placed into formalin time: 10:14 AM on 03/13/2021 Weight: 241 grams Dimensions:      Fundus -11.0 (superior to inferior) by 8.0 (breadth of uterus at fundus) by 5.5 (anterior to posterior) cm      Cervix -3.5 x  3.0 cm with a 1.2 x 0.6 cm slitlike os Serosa: The serosa is pink and smooth with 2 subserosal cystic structures ranging f                         rom 0.3 to 1.5 cm and a subserosal nodule, 1.5 x 1.3 x 0.9 cm Cervix: The cervix is pink and glistening Endocervix: The endocervix is pink, mucoid and finely granular Endometrial cavity:      Dimensions -1.2 x 1.0 cm      Thickness -0.1 cm      Other findings -the endometrium is pink-red and partially constricted, consistent with previous ablation.  There is a 0.3 x 0.2 x 0.1 cm pink polypoid structure in the endometrium. Myometrium:     Thickness -2.5 cm     Other findings -the myometrium is pink and trabeculated. Other comments: None noted  Block summary: 1 -subserosal cyst and nodule 2-representative cervix 12:00 to 3:00 3-representative cervix 3:00 to 6:00 4-representative cervix 6:00 to 9:00 5-representative cervix 9:00 to 12:00 6-polypoid structure, entirely submitted 7-anterior endomyometrium 8-posterior endomyometrium  [facility] 03/13/2021  Final Diagnosis performed by Quay Burow, MD.   Electronically signed 03/14/2021 11:27:25AM The electronic signature ind                         icates that the named Attending Pathologist has evaluated the specimen Technical component performed at [place], [address], [place],  [address] Lab: [phone] Dir: Derinda Late  Perlie Gold, MD, MMM  Professional component performed at [facility], [facility], [place], [place], [place] [address] Lab: [phone] Dir: Dellia Nims. Rubinas, MD    WBC 03/14/2021 8.2  4.0 - 10.5 K/uL Final   RBC 03/14/2021 4.37  3.87 - 5.11 MIL/uL Final   Hemoglobin 03/14/2021 11.8 (A) 12.0 - 15.0 g/dL Final   HCT 03/14/2021 36.0  36.0 - 46.0 % Final   MCV 03/14/2021 82.4  80.0 - 100.0 fL Final   MCH 03/14/2021 27.0  26.0 - 34.0 pg Final   MCHC 03/14/2021 32.8  30.0 - 36.0 g/dL Final   RDW 03/14/2021 13.7  11.5 - 15.5 % Final   Platelets  03/14/2021 338  150 - 400 K/uL Final   nRBC 03/14/2021 0.0  0.0 - 0.2 % Final   Performed at [facility], [place], [place] [address]   Sodium 03/14/2021 134 (A) 135 - 145 mmol/L Final   Potassium 03/14/2021 2.9 (A) 3.5 - 5.1 mmol/L Final   Chloride 03/14/2021 100  98 - 111 mmol/L Final   CO2 03/14/2021 28  22 - 32 mmol/L Final   Glucose, Bld 03/14/2021 113 (A) 70 - 99 mg/dL Final   Glucose reference range applies only to samples taken after fasting for at least 8 hours.   BUN 03/14/2021 11  6 - 20 mg/dL Final   Creatinine, Ser 03/14/2021 0.84  0.44 - 1.00 mg/dL Final   Calcium 03/14/2021 8.4 (A) 8.9 - 10.3 mg/dL Final   GFR, Estimated 03/14/2021 >60  >60 mL/min Final   Comment: (NOTE) Calculated using the CKD-EPI Creatinine Equation (2021)    Anion gap 03/14/2021 6  5 - 15 Final   Performed at [facility], [address] [place], [place] [address]    Assessment: 45 y.o. s/p TLH, cystoscopy stable  Plan: Patient has done well after surgery with no apparent complications.  I have discussed the post-operative course to date, and the expected progress moving forward.  The patient understands what complications to be concerned about.  I will see the patient in routine follow up, or sooner if needed.    Activity plan: No heavy lifting.   Malachy Mood, MD, [facility], [facility] 03/21/2021, 1:25 PM

## 2021-03-24 ENCOUNTER — Ambulatory Visit: Payer: 59 | Admitting: Obstetrics and Gynecology

## 2021-03-28 ENCOUNTER — Ambulatory Visit: Payer: 59 | Admitting: Obstetrics and Gynecology

## 2021-03-28 ENCOUNTER — Encounter: Payer: 59 | Admitting: Obstetrics and Gynecology

## 2021-03-31 ENCOUNTER — Other Ambulatory Visit: Payer: Self-pay

## 2021-04-03 ENCOUNTER — Ambulatory Visit: Payer: Self-pay | Admitting: Obstetrics and Gynecology

## 2021-04-11 ENCOUNTER — Ambulatory Visit: Payer: 59 | Admitting: Obstetrics and Gynecology

## 2021-04-17 NOTE — Discharge Summary (Signed)
Physician Discharge Summary  Patient ID: Sharon Zimmerman MRN: [id number] DOB/AGE: February 02, 1976 45 y.o.  Admit date: 03/13/2021 Discharge date: 03/14/2021  Admission Diagnoses: Scheduled surgery  Discharge Diagnoses:  Active Problems:   Hematometra   S/P laparoscopic hysterectomy   Pelvic pain   Pelvic adhesions   Discharged Condition: stable  Hospital Course: 45 y.o. G3P3 presenting for scheduled hysterectomy secondary to pelvic pain.  Surgery underwent without complications. She was meeting all postoperative goals by POD1 including voiding, ambulating, tolerating po, and reporting good pain control on po analgesics.  Vitals signs remained stable throughout admission.   Consults: inone  Significant Diagnostic Studies: none  Treatments: surgery: TLH, BS, cystoscopy  Discharge Exam: Blood pressure 123/76, pulse 88, temperature 98 F (36.7 C), temperature source Oral, resp. rate 18, height 5' 7.5" (1.715 m), weight 125.9 kg, SpO2 97 %. General appearance: alert, cooperative, and appears stated age GI: soft, non-tender; bowel sounds normal; no masses,  no organomegaly Extremities: extremities normal, atraumatic, no cyanosis or edema Incision/Wound:D/C/I  Disposition: Discharge disposition: 01-Home or Self Care       Discharge Instructions     Call MD for:   Complete by: As directed    Heavy vaginal bleeding greater than 1 pad an hour   Call MD for:  difficulty breathing, headache or visual disturbances   Complete by: As directed    Call MD for:  extreme fatigue   Complete by: As directed    Call MD for:  hives   Complete by: As directed    Call MD for:  persistant dizziness or light-headedness   Complete by: As directed    Call MD for:  persistant nausea and vomiting   Complete by: As directed    Call MD for:  redness, tenderness, or signs of infection (pain, swelling, redness, odor or green/yellow discharge around incision site)   Complete by: As directed    Call  MD for:  severe uncontrolled pain   Complete by: As directed    Call MD for:  temperature >100.4   Complete by: As directed    Diet general   Complete by: As directed    Discharge wound care:   Complete by: As directed    You may apply a light dressing for minor discharge from the incision or to keep waistbands of clothing from rubbing.  You may also have been discharge with a clear dressing in which case this will be removed at your postoperative clinic visit.  You may shower, use soap on your incision.  Avoid baths or soaking the incision in the first 6 weeks following your surgery..   Driving restriction   Complete by: As directed    Avoid driving for at least 2 weeks or while taking prescription narcotics.   Lifting restrictions   Complete by: As directed    Weight restriction of 10lbs for 6 weeks.      Allergies as of 03/14/2021       Reactions   Lisinopril Cough   Tape Rash        Medication List     STOP taking these medications    HYDROcodone-acetaminophen 5-325 MG tablet Commonly known as: NORCO/VICODIN   ibuprofen 800 MG tablet Commonly known as: ADVIL   oxyCODONE 5 MG immediate release tablet Commonly known as: Oxy IR/ROXICODONE       TAKE these medications    acetaminophen 500 MG tablet Commonly known as: TYLENOL Take 1,000 mg by mouth every 6 (six) hours  as needed (for pain.).   amLODipine 10 MG tablet Commonly known as: NORVASC Take 10 mg by mouth at bedtime.   ammonium lactate 12 % cream Commonly known as: AMLACTIN Apply topically 2 (two) times daily.   amphetamine-dextroamphetamine 15 MG tablet Commonly known as: ADDERALL Take 15 mg by mouth 2 (two) times daily.   CALCIUM + D PO Take 1 tablet by mouth daily.   DULoxetine 60 MG capsule Commonly known as: CYMBALTA Take 60 mg by mouth at bedtime.   gabapentin 300 MG capsule Commonly known as: NEURONTIN Take 300 mg by mouth at bedtime.   hydrochlorothiazide 25 MG tablet Commonly  known as: HYDRODIURIL Take 25 mg by mouth daily.   hydrocortisone 2.5 % cream Apply 1 application topically daily.   losartan 100 MG tablet Commonly known as: COZAAR Take 100 mg by mouth daily.   meloxicam 15 MG tablet Commonly known as: MOBIC Take 15 mg by mouth daily.   metoprolol succinate 25 MG 24 hr tablet Commonly known as: TOPROL-XL Take 25 mg by mouth daily.   Ozempic (0.25 or 0.5 MG/DOSE) 2 MG/1.5ML Sopn Generic drug: Semaglutide(0.25 or 0.5MG /DOS) Inject 0.25 mg as directed once a week.   pantoprazole 20 MG tablet Commonly known as: PROTONIX Take 20 mg by mouth daily before breakfast.   phentermine 30 MG capsule Take 30 mg by mouth daily.   topiramate 25 MG tablet Commonly known as: TOPAMAX Take 25 mg by mouth daily.   vitamin C 1000 MG tablet Take 1,000 mg by mouth once a week.   Vitamin D3 50 MCG (2000 UT) Tabs Take 4,000 Units by mouth daily.               Discharge Care Instructions  (From admission, onward)           Start     Ordered   03/14/21 0000  Discharge wound care:       Comments: You may apply a light dressing for minor discharge from the incision or to keep waistbands of clothing from rubbing.  You may also have been discharge with a clear dressing in which case this will be removed at your postoperative clinic visit.  You may shower, use soap on your incision.  Avoid baths or soaking the incision in the first 6 weeks following your surgery.[name] Kitchen   03/14/21 0809             Signed: Malachy Mood 04/17/2021, 8:12 AM

## 2021-04-25 ENCOUNTER — Encounter: Payer: Self-pay | Admitting: Obstetrics and Gynecology

## 2021-04-25 ENCOUNTER — Ambulatory Visit (INDEPENDENT_AMBULATORY_CARE_PROVIDER_SITE_OTHER): Payer: Medicaid - Out of State | Admitting: Obstetrics and Gynecology

## 2021-04-25 ENCOUNTER — Other Ambulatory Visit: Payer: Self-pay

## 2021-04-25 VITALS — BP 140/84 | Ht 67.5 in | Wt 281.0 lb

## 2021-04-25 DIAGNOSIS — Z4889 Encounter for other specified surgical aftercare: Secondary | ICD-10-CM

## 2021-04-25 NOTE — Progress Notes (Signed)
Postoperative Follow-up Patient presents post op from [place], BS, cystoscopy 6weeks ago for abnormal uterine bleeding.  Subjective: Patient reports some improvement in her preop symptoms. Eating a regular diet without difficulty. The patient is not having any pain.  Activity: normal activities of daily living.  Objective: Blood pressure 140/84, height 5' 7.5" (1.715 m), weight 281 lb (127.5 kg), last menstrual period 02/20/2020.  General: NAD Pulmonary: no increased work of breathing Abdomen: soft, non-tender, non-distended, incision(s) D/C/I GU: normal external female genitalia vaginal cuff intact, well healed Extremities: no edema Neurologic: normal gait   Admission on 03/13/2021, Discharged on 03/14/2021  Component Date Value Ref Range Status   ABO/RH(D) 03/13/2021    Final                   Value:B POS Performed at [facility], [address] [place], [place] [address]    Preg Test, Ur 03/13/2021 NEGATIVE  NEGATIVE Final   Comment:        THE SENSITIVITY OF THIS METHODOLOGY IS >24 mIU/mL    SURGICAL PATHOLOGY 03/13/2021    Final-Edited                   Value:SURGICAL PATHOLOGY CASE: [id number] PATIENT: Evelene Croon Surgical Pathology Report     Specimen Submitted: A. Uterus, cervix  Clinical History: Abdominal pain, hematometra      DIAGNOSIS: A. UTERUS WITH CERVIX; TOTAL HYSTERECTOMY: - CERVIX WITH SQUAMOUS AND CILIATED METAPLASIA. - PROLIFERATIVE ENDOMETRIUM WITH CHANGES CONSISTENT WITH PRIOR ABLATION. - MYOMETRIUM WITH LEIOMYOMATA AND ADENOMYOSIS. 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 - NEGATIVE FOR ATYPIA AND MALIGNANCY.  GROSS DESCRIPTION: A. Labeled: Uterus and cervix Received: Formalin Collection time: 10:10 AM on 03/13/2021 Placed into formalin time: 10:14 AM on 03/13/2021 Weight: 241 grams Dimensions:      Fundus -11.0 (superior to inferior) by 8.0 (breadth of uterus at fundus)  by 5.5 (anterior to posterior) cm      Cervix -3.5 x 3.0 cm with a 1.2 x 0.6 cm slitlike os Serosa: The serosa is pink and smooth with 2 subserosal cystic structures ranging f                         rom 0.3 to 1.5 cm and a subserosal nodule, 1.5 x 1.3 x 0.9 cm Cervix: The cervix is pink and glistening Endocervix: The endocervix is pink, mucoid and finely granular Endometrial cavity:      Dimensions -1.2 x 1.0 cm      Thickness -0.1 cm      Other findings -the endometrium is pink-red and partially constricted, consistent with previous ablation.  There is a 0.3 x 0.2 x 0.1 cm pink polypoid structure in the endometrium. Myometrium:     Thickness -2.5 cm     Other findings -the myometrium is pink and trabeculated. Other comments: None noted  Block summary: 1 -subserosal cyst and nodule 2-representative cervix 12:00 to 3:00 3-representative cervix 3:00 to 6:00 4-representative cervix 6:00 to 9:00 5-representative cervix 9:00 to 12:00 6-polypoid structure, entirely submitted 7-anterior endomyometrium 8-posterior endomyometrium  [facility] 03/13/2021  Final Diagnosis performed by Quay Burow, MD.   Electronically signed 03/14/2021 11:27:25AM The electronic signature ind                         icates that the named Attending Pathologist has evaluated the specimen Technical component performed  at [facility], [address] [place], [place] [address] Lab: [phone] Dir: Rush Farmer, MD, MMM  Professional component performed at [facility], [facility], [place], [place], [place] [address] Lab: [phone] Dir: Dellia Nims. Rubinas, MD    WBC 03/14/2021 8.2  4.0 - 10.5 K/uL Final   RBC 03/14/2021 4.37  3.87 - 5.11 MIL/uL Final   Hemoglobin 03/14/2021 11.8 (A)  12.0 - 15.0 g/dL Final   HCT 03/14/2021 36.0  36.0 - 46.0 % Final   MCV 03/14/2021 82.4  80.0 - 100.0 fL Final   MCH 03/14/2021 27.0  26.0 - 34.0 pg Final   MCHC 03/14/2021 32.8  30.0 - 36.0 g/dL Final   RDW  03/14/2021 13.7  11.5 - 15.5 % Final   Platelets 03/14/2021 338  150 - 400 K/uL Final   nRBC 03/14/2021 0.0  0.0 - 0.2 % Final   Performed at [facility], [place], [place] [address]   Sodium 03/14/2021 134 (A)  135 - 145 mmol/L Final   Potassium 03/14/2021 2.9 (A)  3.5 - 5.1 mmol/L Final   Chloride 03/14/2021 100  98 - 111 mmol/L Final   CO2 03/14/2021 28  22 - 32 mmol/L Final   Glucose, Bld 03/14/2021 113 (A)  70 - 99 mg/dL Final   Glucose reference range applies only to samples taken after fasting for at least 8 hours.   BUN 03/14/2021 11  6 - 20 mg/dL Final   Creatinine, Ser 03/14/2021 0.84  0.44 - 1.00 mg/dL Final   Calcium 03/14/2021 8.4 (A)  8.9 - 10.3 mg/dL Final   GFR, Estimated 03/14/2021 >60  >60 mL/min Final   Comment: (NOTE) Calculated using the CKD-EPI Creatinine Equation (2021)    Anion gap 03/14/2021 6  5 - 15 Final   Performed at [facility], [address], [place], [place] [address]    Assessment: 45 y.o. s/p TLH, BS, cystoscopy stable  Plan: Patient has done well after surgery with no apparent complications.  I have discussed the post-operative course to date, and the expected progress moving forward.  The patient understands what complications to be concerned about.  I will see the patient in routine follow up, or sooner if needed.    Activity plan: No restriction.   Malachy Mood, MD, [facility], [facility] 04/25/2021, 1:13 PM

## 2021-05-14 ENCOUNTER — Other Ambulatory Visit: Payer: Self-pay

## 2021-05-14 ENCOUNTER — Encounter (HOSPITAL_COMMUNITY)
Admission: RE | Admit: 2021-05-14 | Discharge: 2021-05-14 | Disposition: A | Discharge status: Home / Self Care | Payer: BC Managed Care – PPO | Source: Ambulatory Visit | Attending: Family Medicine | Admitting: Family Medicine

## 2021-05-14 DIAGNOSIS — D509 Iron deficiency anemia, unspecified: Secondary | ICD-10-CM | POA: Diagnosis not present

## 2021-05-14 DIAGNOSIS — Z9884 Bariatric surgery status: Secondary | ICD-10-CM | POA: Diagnosis not present

## 2021-05-14 MED ORDER — SODIUM CHLORIDE 0.9 % IV SOLN: INTRAVENOUS | Status: DC

## 2021-05-14 MED ORDER — SODIUM CHLORIDE 0.9 % IV SOLN
510.0000 mg | Freq: Once | INTRAVENOUS | Status: AC
  Administered 2021-05-14: 510 mg via INTRAVENOUS
  Filled 2021-05-14: qty 17

## 2021-05-19 ENCOUNTER — Ambulatory Visit: Payer: 59 | Admitting: Obstetrics and Gynecology

## 2021-05-21 ENCOUNTER — Other Ambulatory Visit: Payer: Self-pay

## 2021-05-21 ENCOUNTER — Encounter (HOSPITAL_COMMUNITY)
Admission: RE | Admit: 2021-05-21 | Discharge: 2021-05-21 | Disposition: A | Discharge status: Home / Self Care | Payer: BC Managed Care – PPO | Source: Ambulatory Visit | Attending: Family Medicine | Admitting: Family Medicine

## 2021-05-21 DIAGNOSIS — D509 Iron deficiency anemia, unspecified: Secondary | ICD-10-CM | POA: Diagnosis not present

## 2021-05-21 MED ORDER — SODIUM CHLORIDE 0.9 % IV SOLN
510.0000 mg | Freq: Once | INTRAVENOUS | Status: AC
  Administered 2021-05-21: 510 mg via INTRAVENOUS
  Filled 2021-05-21: qty 510

## 2021-05-21 MED ORDER — SODIUM CHLORIDE 0.9 % IV SOLN: INTRAVENOUS | Status: DC

## 2021-05-30 ENCOUNTER — Encounter: Payer: Self-pay | Admitting: Obstetrics and Gynecology

## 2021-06-02 ENCOUNTER — Other Ambulatory Visit: Payer: Self-pay | Admitting: Obstetrics and Gynecology

## 2021-06-02 MED ORDER — FLUCONAZOLE 150 MG PO TABS: 150.0000 mg | ORAL_TABLET | Freq: Once | ORAL | 0 refills | Status: AC

## 2021-06-02 NOTE — Progress Notes (Signed)
Vaginal candida symptoms empiric treatment diflucan

## 2021-06-03 NOTE — Telephone Encounter (Signed)
Mirena rcvd/charged 11/13/2019

## 2021-07-17 ENCOUNTER — Encounter: Payer: Self-pay | Admitting: Obstetrics and Gynecology

## 2021-08-21 ENCOUNTER — Encounter (INDEPENDENT_AMBULATORY_CARE_PROVIDER_SITE_OTHER): Payer: Self-pay | Admitting: *Deleted

## 2021-10-19 IMAGING — RF DG UGI W SINGLE CM
12 of 14 series · 14 of 24 positions shown · non-contrast
Comparison: None.

CLINICAL DATA: History of gastric bypass surgery with intermittent
abdominal pain.

EXAM:
UPPER GI SERIES WITH KUB
TECHNIQUE: After obtaining a scout radiograph a routine upper GI series was
performed using thin density barium
FLUOROSCOPY TIME:  Fluoroscopy Time:  2 minutes and 54 seconds
Radiation Exposure Index (if provided by the fluoroscopic device):
1,597 mGy
Number of Acquired Spot Images: 0

[Series 1: one shot · 0.14mm/px · 1 of 1 slices shown (1 of 3)]
[im 1/1]
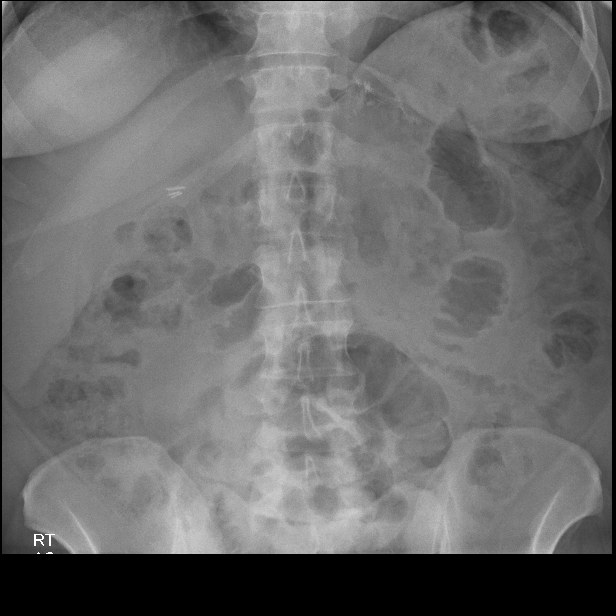

[Series 2: sequence · 1 of 18 frames shown (1 of 9)]
[frame 16/18]
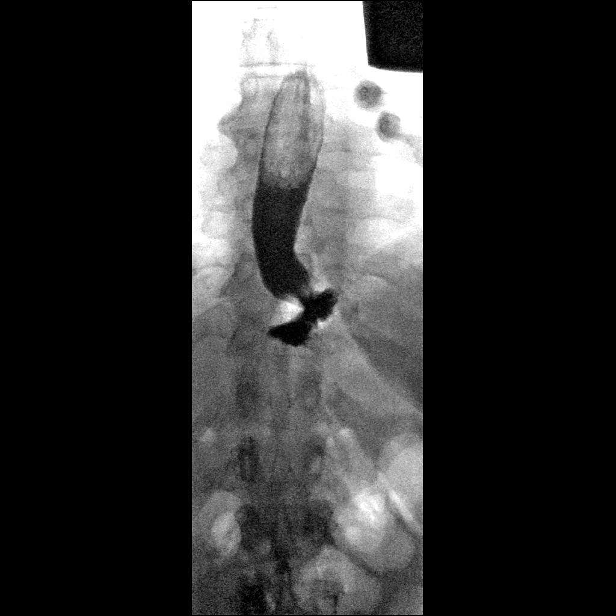

[Series 3: sequence · 1 of 17 frames shown (2 of 9)]
[frame 9/17]
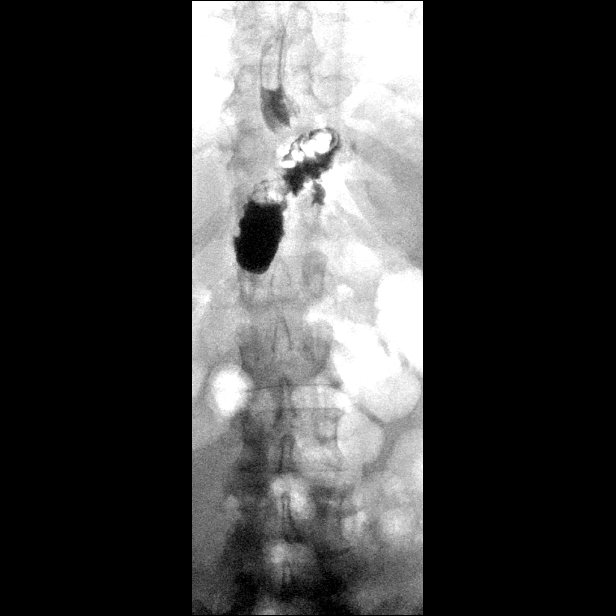

[Series 4: sequence · 2 of 35 frames shown (3 of 9)]
[frame 18/35]
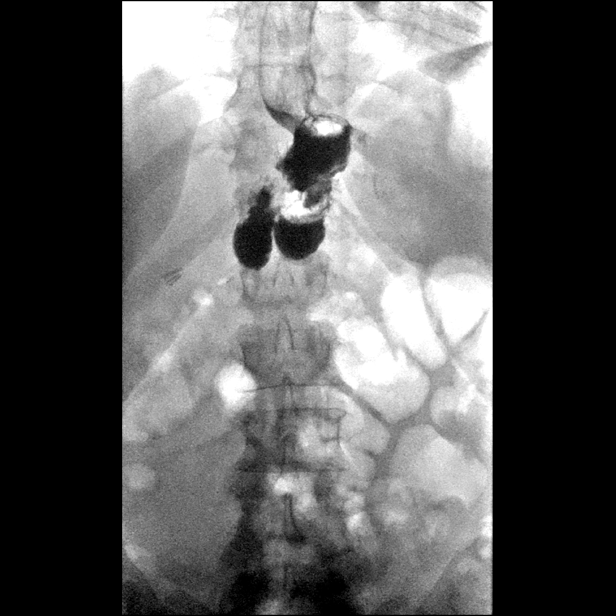
[frame 30/35]
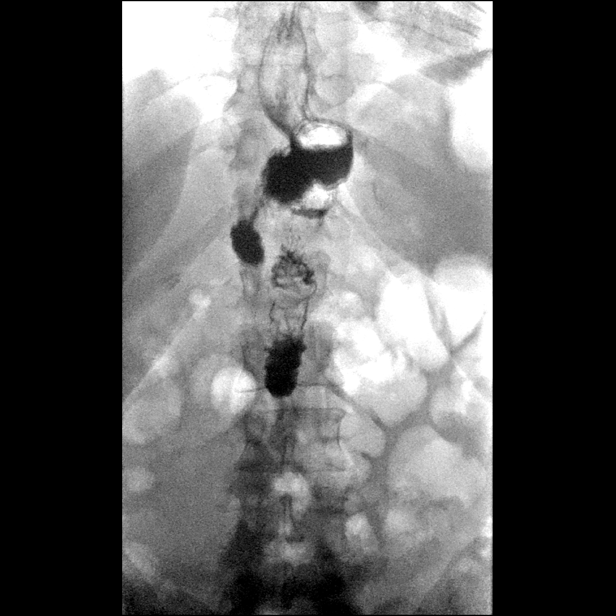

[Series 6: sequence · 1 of 37 frames shown (4 of 9)]
[frame 19/37]
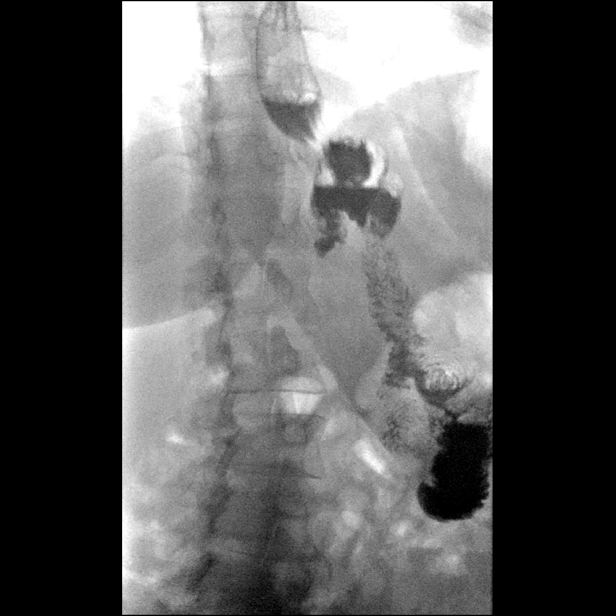

[Series 8: sequence · 2 of 26 frames shown (5 of 9)]
[frame 12/26]
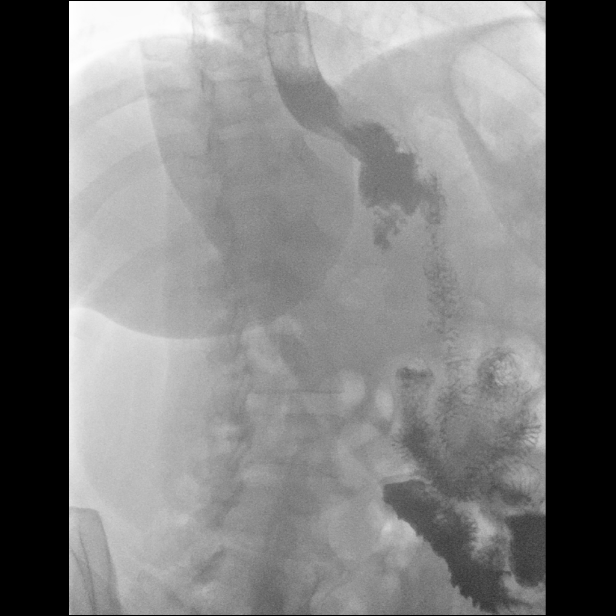
[frame 14/26]
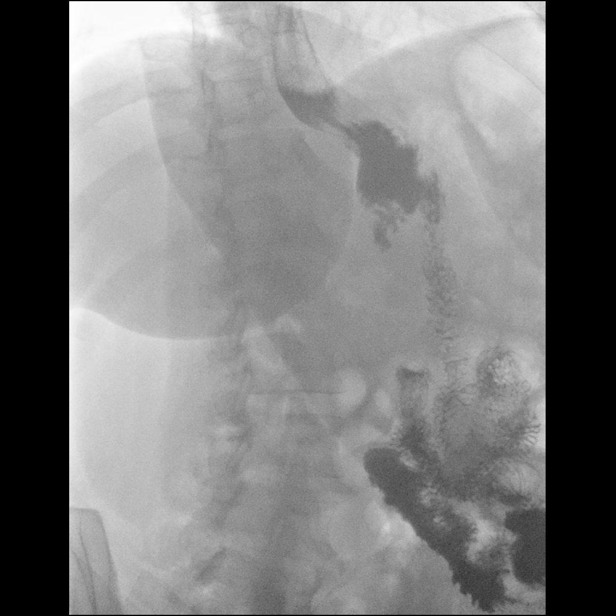

[Series 10: sequence · 1 of 20 frames shown (6 of 9)]
[frame 4/20]
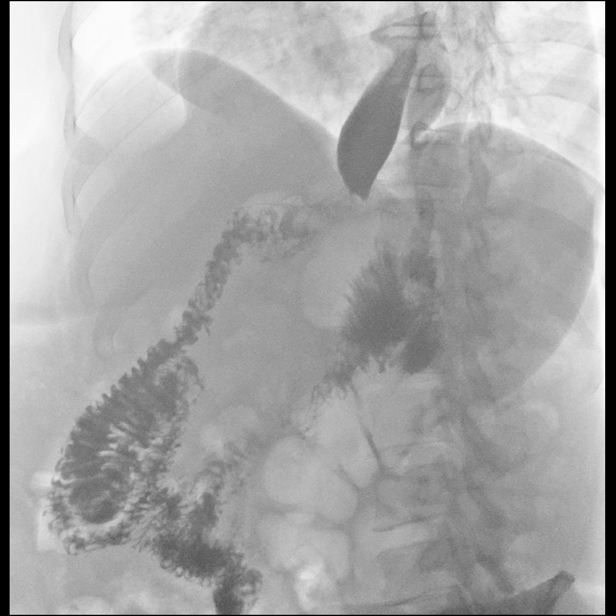

[Series 11: sequence · 1 of 6 frames shown (7 of 9)]
[frame 1/6]
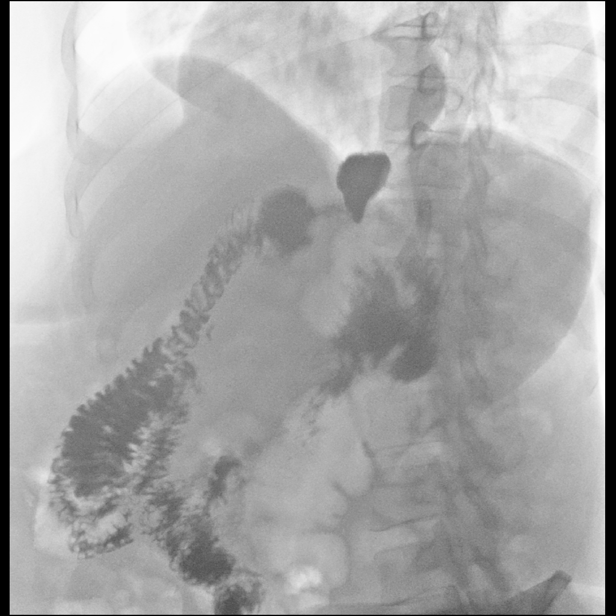

[Series 12: one shot · 0.15mm/px · 1 of 1 slices shown (2 of 3)]
[im 1/1]
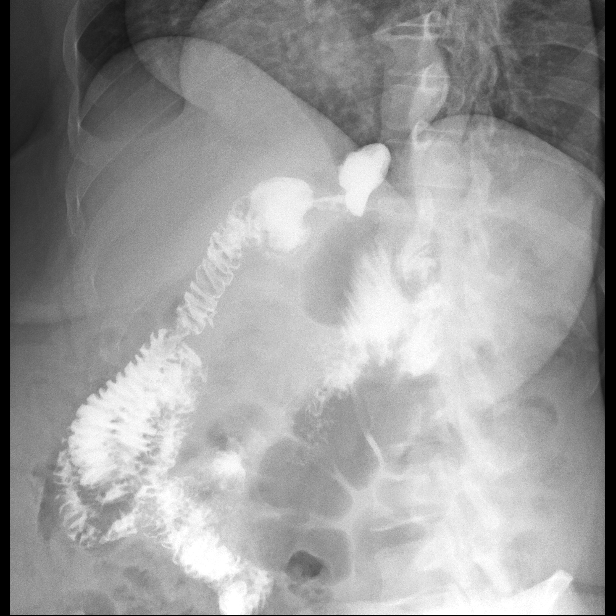

[Series 13: sequence · 1 of 21 frames shown (8 of 9)]
[frame 4/21]
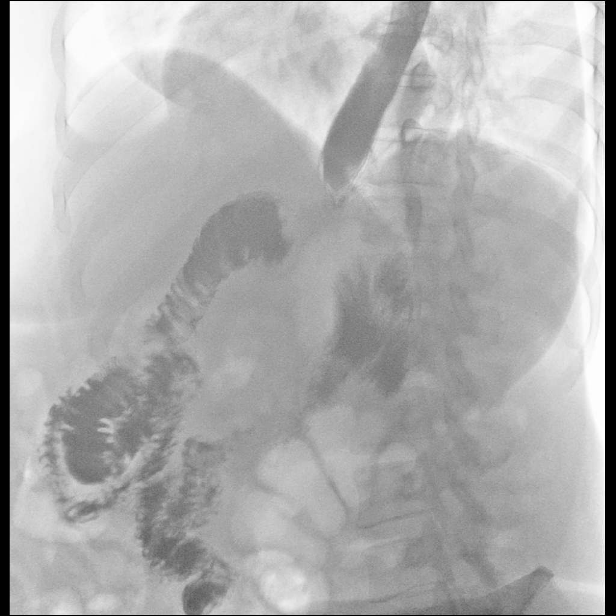

[Series 14: sequence · 1 of 34 frames shown (9 of 9)]
[frame 6/34]
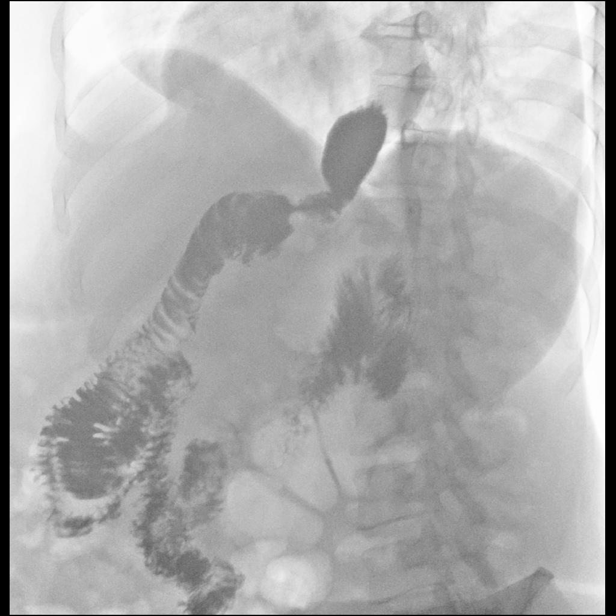

[Series 15: one shot · 0.15mm/px · 1 of 1 slices shown (3 of 3)]
[im 1/1]
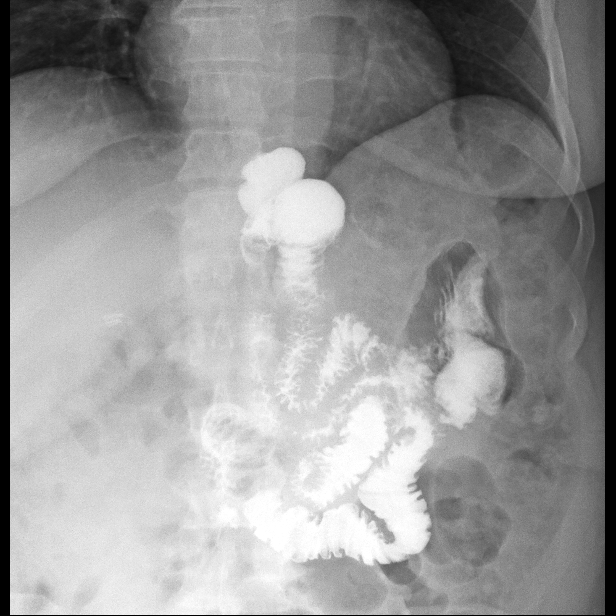

[14 of 24 positions shown; findings below may reference images not displayed]

FINDINGS: Initial barium swallows demonstrate normal esophageal motility. No
intrinsic or extrinsic lesions of the esophagus or identified. No
distal mass or stricture. No hiatal hernia.

Small gastric pouch appears normal. Immediate emptying into the
jejunal bypass loop. Normal passage of contrast into the mid small
bowel loops. No findings for obstruction or stricture. No findings
for fistula.
IMPRESSION: 1. Normal esophagus.
2. Normal gastric pouch and small bowel. No complicating features
associated with the gastric bypass surgery are identified.

## 2022-01-09 DIAGNOSIS — S66912A Strain of unspecified muscle, fascia and tendon at wrist and hand level, left hand, initial encounter: Secondary | ICD-10-CM | POA: Insufficient documentation

## 2022-01-09 DIAGNOSIS — S56212D Strain of other flexor muscle, fascia and tendon at forearm level, left arm, subsequent encounter: Secondary | ICD-10-CM | POA: Insufficient documentation

## 2022-02-04 ENCOUNTER — Encounter (INDEPENDENT_AMBULATORY_CARE_PROVIDER_SITE_OTHER): Payer: Self-pay | Admitting: *Deleted

## 2022-02-06 ENCOUNTER — Encounter (INDEPENDENT_AMBULATORY_CARE_PROVIDER_SITE_OTHER): Payer: Self-pay | Admitting: *Deleted

## 2022-04-19 DIAGNOSIS — Z5321 Procedure and treatment not carried out due to patient leaving prior to being seen by health care provider: Secondary | ICD-10-CM | POA: Insufficient documentation

## 2022-04-19 DIAGNOSIS — R1110 Vomiting, unspecified: Secondary | ICD-10-CM | POA: Insufficient documentation

## 2022-04-19 NOTE — ED Notes (Signed)
Patient to waiting room via EMS from home.  Per EMS patient with complaint of vomiting for 1 hour and concerned sugar was up.  EMS interventions -  IV via 20 g angiocath to right ac, receiving 500 ml NS, given Zofran 4 mg IV, cbg 174, hr 110, bp 110/64, pulse oxi 99% on room air.

## 2022-04-20 ENCOUNTER — Other Ambulatory Visit: Payer: Self-pay

## 2022-04-20 ENCOUNTER — Emergency Department
Admission: EM | Admit: 2022-04-20 | Discharge: 2022-04-20 | Discharge status: Against Medical Advice | Payer: BC Managed Care – PPO | Attending: Emergency Medicine | Admitting: Emergency Medicine

## 2022-04-20 DIAGNOSIS — R1110 Vomiting, unspecified: Secondary | ICD-10-CM | POA: Diagnosis not present

## 2022-04-20 LAB — CBC
HCT: 38.2 % (ref 36.0–46.0)
Hemoglobin: 12 g/dL (ref 12.0–15.0)
MCH: 27.1 pg (ref 26.0–34.0)
MCHC: 31.4 g/dL (ref 30.0–36.0)
MCV: 86.2 fL (ref 80.0–100.0)
Platelets: 339 10*3/uL (ref 150–400)
RBC: 4.43 MIL/uL (ref 3.87–5.11)
RDW: 12.5 % (ref 11.5–15.5)
WBC: 5.9 10*3/uL (ref 4.0–10.5)
nRBC: 0 % (ref 0.0–0.2)

## 2022-04-20 LAB — COMPREHENSIVE METABOLIC PANEL
ALT: 10 U/L (ref 0–44)
AST: 16 U/L (ref 15–41)
Albumin: 3.7 g/dL (ref 3.5–5.0)
Alkaline Phosphatase: 123 U/L (ref 38–126)
Anion gap: 5 (ref 5–15)
BUN: 14 mg/dL (ref 6–20)
CO2: 27 mmol/L (ref 22–32)
Calcium: 8.7 mg/dL — ABNORMAL LOW (ref 8.9–10.3)
Chloride: 107 mmol/L (ref 98–111)
Creatinine, Ser: 1.03 mg/dL — ABNORMAL HIGH (ref 0.44–1.00)
GFR, Estimated: >60 mL/min (ref >60)
Glucose, Bld: 89 mg/dL (ref 70–99)
Potassium: 3.1 mmol/L — ABNORMAL LOW (ref 3.5–5.1)
Sodium: 139 mmol/L (ref 135–145)
Total Bilirubin: 0.5 mg/dL (ref 0.3–1.2)
Total Protein: 6.9 g/dL (ref 6.5–8.1)

## 2022-04-20 LAB — LIPASE, BLOOD: Lipase: 42 U/L (ref 11–51)

## 2022-04-20 NOTE — ED Triage Notes (Signed)
Pt states sudden onset of vomiting that began at 2230 tonight. Pt with moist oral mucus membranes. Pt appears in no acute distress, ems gave 572m ns bolus and zofran.

## 2022-08-07 ENCOUNTER — Encounter (INDEPENDENT_AMBULATORY_CARE_PROVIDER_SITE_OTHER): Payer: Self-pay | Admitting: *Deleted

## 2023-02-08 ENCOUNTER — Other Ambulatory Visit: Payer: Self-pay

## 2023-03-23 ENCOUNTER — Other Ambulatory Visit: Payer: Self-pay

## 2023-03-23 ENCOUNTER — Telehealth: Payer: Self-pay | Admitting: Pharmacy Technician

## 2023-03-23 DIAGNOSIS — D509 Iron deficiency anemia, unspecified: Secondary | ICD-10-CM | POA: Insufficient documentation

## 2023-03-23 DIAGNOSIS — D508 Other iron deficiency anemias: Secondary | ICD-10-CM

## 2023-03-23 NOTE — Telephone Encounter (Signed)
Auth Submission: NO AUTH NEEDED Site of care: Site of care: [facility] Payer[place] Medication & CPT/J Code(s) submitted: Feraheme (ferumoxytol) [id number] Route of submission (phone, fax, portal):  Phone # Fax # Auth type: Buy/Bill HB Units/visits requested: X2 Reference number:  Approval from: 03/23/23 to 07/06/23

## 2023-04-02 ENCOUNTER — Encounter: Payer: Medicaid - Out of State | Attending: Family Medicine | Admitting: Emergency Medicine

## 2023-04-02 VITALS — BP 140/81 | HR 92 | Temp 99.6°F | Resp 18

## 2023-04-02 DIAGNOSIS — D508 Other iron deficiency anemias: Secondary | ICD-10-CM | POA: Insufficient documentation

## 2023-04-02 MED ORDER — SODIUM CHLORIDE 0.9 % IV SOLN
510.0000 mg | Freq: Once | INTRAVENOUS | Status: AC
  Administered 2023-04-02: 510 mg via INTRAVENOUS
  Filled 2023-04-02: qty 17

## 2023-04-02 MED ORDER — ACETAMINOPHEN 325 MG PO TABS
650.0000 mg | ORAL_TABLET | Freq: Once | ORAL | Status: AC
  Administered 2023-04-02: 650 mg via ORAL
  Filled 2023-04-02: qty 2

## 2023-04-02 MED ORDER — DIPHENHYDRAMINE HCL 25 MG PO CAPS
25.0000 mg | ORAL_CAPSULE | Freq: Once | ORAL | Status: AC
  Administered 2023-04-02: 25 mg via ORAL
  Filled 2023-04-02: qty 1

## 2023-04-02 NOTE — Progress Notes (Signed)
Diagnosis: Iron Deficiency Anemia  Provider:  Orpah Cobb DO  Procedure: IV Infusion  IV Type: Peripheral, IV Location: L Antecubital  Feraheme (Ferumoxytol), Dose: 510 mg  Infusion Start Time: 0955  Infusion Stop Time: 1012  Post Infusion IV Care: Observation period completed and Peripheral IV Discontinued  Discharge: Condition: Good, Destination: Home . AVS Provided  Performed by:  Arrie Senate, RN

## 2023-04-09 ENCOUNTER — Encounter: Payer: Medicaid - Out of State | Attending: Family Medicine | Admitting: Emergency Medicine

## 2023-04-09 VITALS — BP 137/96 | HR 73 | Temp 98.0°F | Resp 18

## 2023-04-09 DIAGNOSIS — D508 Other iron deficiency anemias: Secondary | ICD-10-CM | POA: Diagnosis present

## 2023-04-09 MED ORDER — DIPHENHYDRAMINE HCL 25 MG PO CAPS
25.0000 mg | ORAL_CAPSULE | Freq: Once | ORAL | Status: AC
  Administered 2023-04-09: 25 mg via ORAL

## 2023-04-09 MED ORDER — ACETAMINOPHEN 325 MG PO TABS
650.0000 mg | ORAL_TABLET | Freq: Once | ORAL | Status: AC
  Administered 2023-04-09: 650 mg via ORAL

## 2023-04-09 MED ORDER — SODIUM CHLORIDE 0.9 % IV SOLN
510.0000 mg | Freq: Once | INTRAVENOUS | Status: AC
  Administered 2023-04-09: 510 mg via INTRAVENOUS
  Filled 2023-04-09: qty 17

## 2023-04-09 NOTE — Progress Notes (Signed)
Diagnosis: Iron Deficiency Anemia  Provider:  Orpah Cobb DO  Procedure: IV Infusion  IV Type: Peripheral, IV Location: L Antecubital  Feraheme (Ferumoxytol), Dose: 510 mg  Infusion Start Time: 0948  Infusion Stop Time: 1006  Post Infusion IV Care: Observation period completed and Peripheral IV Discontinued  Discharge: Condition: Good, Destination: Home . AVS Provided  Performed by:  Arrie Senate, RN

## 2023-10-19 DIAGNOSIS — M25531 Pain in right wrist: Secondary | ICD-10-CM | POA: Insufficient documentation

## 2023-11-05 DIAGNOSIS — G5601 Carpal tunnel syndrome, right upper limb: Secondary | ICD-10-CM | POA: Insufficient documentation

## 2023-11-22 ENCOUNTER — Telehealth: Payer: Self-pay | Admitting: Podiatry

## 2023-11-22 ENCOUNTER — Encounter: Payer: Self-pay | Admitting: Podiatry

## 2023-11-22 ENCOUNTER — Ambulatory Visit (INDEPENDENT_AMBULATORY_CARE_PROVIDER_SITE_OTHER)

## 2023-11-22 ENCOUNTER — Ambulatory Visit: Admitting: Podiatry

## 2023-11-22 VITALS — Ht 67.0 in | Wt 258.0 lb

## 2023-11-22 DIAGNOSIS — M76821 Posterior tibial tendinitis, right leg: Secondary | ICD-10-CM | POA: Diagnosis not present

## 2023-11-22 DIAGNOSIS — M2141 Flat foot [pes planus] (acquired), right foot: Secondary | ICD-10-CM

## 2023-11-22 DIAGNOSIS — M75120 Complete rotator cuff tear or rupture of unspecified shoulder, not specified as traumatic: Secondary | ICD-10-CM | POA: Insufficient documentation

## 2023-11-22 DIAGNOSIS — M2142 Flat foot [pes planus] (acquired), left foot: Secondary | ICD-10-CM

## 2023-11-22 DIAGNOSIS — E785 Hyperlipidemia, unspecified: Secondary | ICD-10-CM | POA: Insufficient documentation

## 2023-11-22 DIAGNOSIS — M6281 Muscle weakness (generalized): Secondary | ICD-10-CM | POA: Insufficient documentation

## 2023-11-22 DIAGNOSIS — K219 Gastro-esophageal reflux disease without esophagitis: Secondary | ICD-10-CM | POA: Insufficient documentation

## 2023-11-22 DIAGNOSIS — M179 Osteoarthritis of knee, unspecified: Secondary | ICD-10-CM | POA: Insufficient documentation

## 2023-11-22 DIAGNOSIS — M76822 Posterior tibial tendinitis, left leg: Secondary | ICD-10-CM

## 2023-11-22 DIAGNOSIS — M48061 Spinal stenosis, lumbar region without neurogenic claudication: Secondary | ICD-10-CM | POA: Insufficient documentation

## 2023-11-22 DIAGNOSIS — M719 Bursopathy, unspecified: Secondary | ICD-10-CM | POA: Insufficient documentation

## 2023-11-22 DIAGNOSIS — M2550 Pain in unspecified joint: Secondary | ICD-10-CM | POA: Insufficient documentation

## 2023-11-22 DIAGNOSIS — I8390 Asymptomatic varicose veins of unspecified lower extremity: Secondary | ICD-10-CM | POA: Insufficient documentation

## 2023-11-22 DIAGNOSIS — M1991 Primary osteoarthritis, unspecified site: Secondary | ICD-10-CM | POA: Insufficient documentation

## 2023-11-22 DIAGNOSIS — R269 Unspecified abnormalities of gait and mobility: Secondary | ICD-10-CM | POA: Insufficient documentation

## 2023-11-22 MED ORDER — CELECOXIB 200 MG PO CAPS: 200.0000 mg | ORAL_CAPSULE | Freq: Two times a day (BID) | ORAL | 3 refills | Status: DC

## 2023-11-22 MED ORDER — METHYLPREDNISOLONE 4 MG PO TBPK: ORAL_TABLET | ORAL | 0 refills | Status: DC

## 2023-11-22 MED ORDER — METHYLPREDNISOLONE 4 MG PO TBPK: ORAL_TABLET | ORAL | 0 refills | Status: AC

## 2023-11-22 NOTE — Progress Notes (Signed)
 Subjective:  Patient ID: Sharon Zimmerman, female    DOB: Mar 24, 1976,  MRN: [id number] HPI Chief Complaint  Patient presents with   Foot Pain    Pt is here for bilateral foot pain, she states that her feet are always in pain, was dx with heel spurs, arthritis and plantar fasciitis in the both feet, states she has tried several things such as purchasing insoles, different shoes, gel pads heel cushions and has not had any relief, states she does not want to get surgery on her feet and is scared to receive injections, she wold like to get fitted for new orthotics.    48 y.o. female presents with the above complaint.   ROS: Denies fever chills nausea mobic muscle aches pains calf pain back pain chest pain shortness of breath.  Past Medical History:  Diagnosis Date   Amenorrhea    Anemia    Complication of anesthesia    Depression    GERD (gastroesophageal reflux disease)    History of hiatal hernia    Hypertension    Idiopathic hypersomnia    Pre-diabetes    Sleep apnea    Past Surgical History:  Procedure Laterality Date   CESAREAN SECTION  x3   felt the cesearean during the first c-section and the second time anesthesia wore off   CHOLECYSTECTOMY, LAPAROSCOPIC     CYSTOSCOPY N/A 03/13/2021   Procedure: CYSTOSCOPY;  Surgeon: Darl Edu, MD;  Location: [facility];  Service: Gynecology;  Laterality: N/A;   DILITATION & CURRETTAGE/HYSTROSCOPY WITH NOVASURE ABLATION N/A 02/20/2020   Procedure: DILATATION & CURETTAGE/HYSTEROSCOPY WITH NOVASURE ABLATION;  Surgeon: Darl Edu, MD;  Location: [facility];  Service: Gynecology;  Laterality: N/A;   GASTRIC BYPASS  2015   IUD REMOVAL N/A 02/20/2020   Procedure: MIRENA  INTRAUTERINE DEVICE (IUD) REMOVAL;  Surgeon: Darl Edu, MD;  Location: [facility];  Service: Gynecology;  Laterality: N/A;   left oopherectomy     TONSILLECTOMY     torn Rotator cuff Right    TOTAL LAPAROSCOPIC HYSTERECTOMY WITH SALPINGECTOMY Bilateral  03/13/2021   Procedure: TOTAL LAPAROSCOPIC HYSTERECTOMY;  Surgeon: Darl Edu, MD;  Location: [facility];  Service: Gynecology;  Laterality: Bilateral;    Current Outpatient Medications:    acetaminophen  (TYLENOL ) 500 MG tablet, Take 1,000 mg by mouth every 6 (six) hours as needed (for pain.)., Disp: , Rfl:    amLODipine (NORVASC) 10 MG tablet, Take 10 mg by mouth at bedtime. , Disp: , Rfl:    ammonium lactate (AMLACTIN) 12 % cream, Apply topically 2 (two) times daily., Disp: , Rfl:    amphetamine-dextroamphetamine (ADDERALL) 15 MG tablet, Take 15 mg by mouth 2 (two) times daily., Disp: , Rfl:    Ascorbic Acid (VITAMIN C) 1000 MG tablet, Take 1,000 mg by mouth once a week., Disp: , Rfl:    Baclofen 5 MG TABS, TAKE 1 TO 2 TABLETS BY MOUTH THREE TIMES DAILY FOR 10 DAYS AS NEEDED, Disp: , Rfl:    Calcium Citrate-Vitamin D (CALCIUM + D PO), Take 1 tablet by mouth daily., Disp: , Rfl:    Cholecalciferol (VITAMIN D3) 50 MCG (2000 UT) TABS, Take 4,000 Units by mouth daily., Disp: , Rfl:    DULoxetine  (CYMBALTA ) 60 MG capsule, Take 60 mg by mouth at bedtime., Disp: , Rfl:    gabapentin  (NEURONTIN ) 300 MG capsule, Take 300 mg by mouth at bedtime., Disp: , Rfl:    hydrochlorothiazide (HYDRODIURIL) 25 MG tablet, Take 25 mg by mouth daily. , Disp: ,  Rfl:    hydrocortisone 2.5 % cream, Apply 1 application topically daily., Disp: , Rfl:    losartan (COZAAR) 100 MG tablet, Take 100 mg by mouth daily., Disp: , Rfl:    metoprolol  succinate (TOPROL -XL) 25 MG 24 hr tablet, Take 25 mg by mouth daily., Disp: , Rfl:    pantoprazole (PROTONIX) 20 MG tablet, Take 20 mg by mouth daily before breakfast. , Disp: , Rfl:    phentermine 30 MG capsule, Take 30 mg by mouth daily., Disp: , Rfl:    Potassium Chloride  ER 20 MEQ TBCR, Take 1 tablet by mouth 2 (two) times daily., Disp: , Rfl:    Semaglutide,0.25 or 0.5MG /DOS, (OZEMPIC, 0.25 OR 0.5 MG/DOSE,) 2 MG/1.5ML SOPN, Inject 0.25 mg as directed once a week., Disp: , Rfl:     topiramate (TOPAMAX) 25 MG tablet, Take 25 mg by mouth daily., Disp: , Rfl:    busPIRone (BUSPAR) 10 MG tablet, Take 10 mg by mouth 2 (two) times daily., Disp: , Rfl:    celecoxib  (CELEBREX ) 200 MG capsule, Take 1 capsule (200 mg total) by mouth 2 (two) times daily., Disp: 60 capsule, Rfl: 3   methylPREDNISolone  (MEDROL  DOSEPAK) 4 MG TBPK tablet, 6 day dose pack - take as directed, Disp: 21 tablet, Rfl: 0   oxyCODONE -acetaminophen  (PERCOCET/ROXICET) 5-325 MG tablet, Take 1 tablet by mouth every 4 (four) hours as needed for severe pain or moderate pain ((when tolerating fluids)). (Patient not taking: Reported on 11/22/2023), Disp: 30 tablet, Rfl: 0   XYWAV 500 MG/ML SOLN, , Disp: , Rfl:   Allergies  Allergen Reactions   Lisinopril Cough   Allevyn Adhesive [Wound Dressings] Itching   Latex Other (See Comments)    Other Reaction(s): rash/itching   Silicone Dermatitis   Tape Rash   Review of Systems Objective:  There were no vitals filed for this visit.  General: Well developed, nourished, in no acute distress, alert and oriented x3   Dermatological: Skin is warm, dry and supple bilateral. Nails x 10 are well maintained; remaining integument appears unremarkable at this time. There are no open sores, no preulcerative lesions, no rash or signs of infection present.  Vascular: Dorsalis Pedis artery and Posterior Tibial artery pedal pulses are 2/4 bilateral with immedate capillary fill time. Pedal hair growth present. No varicosities and no lower extremity edema present bilateral.   Neruologic: Grossly intact via light touch bilateral. Vibratory intact via tuning fork bilateral. Protective threshold with Semmes Wienstein monofilament intact to all pedal sites bilateral. Patellar and Achilles deep tendon reflexes 2+ bilateral. No Babinski or clonus noted bilateral.   Musculoskeletal: No gross boney pedal deformities bilateral. No pain, crepitus, or limitation noted with foot and ankle range of  motion bilateral. Muscular strength 5/5 in all groups tested bilateral.  Severe pes planus with posterior tibial tendon dysfunction and pain on palpation of the tendon.  There appears to be fluctuance.  Gait: Unassisted, Nonantalgic.    Radiographs:  Radiographs taken today demonstrate osseously mature individual with significant plantar heel spurring and osteoarthritic changes in the midfoot.  Considerable edema or fatty adipose subcutaneous tissue.  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  Should this not render the patient asymptomatic will consider MRI for evaluation of the posterior tibial tendon.     Doreena Maulden T. [place], [place]

## 2023-11-22 NOTE — Telephone Encounter (Signed)
 Patient stated order requested by Dr. Lara Plants for medications went to incorrect pharmacy,Methyl Prednisolone(Medrol  Dosepak) 4 MG TBPK tablet/ Celecoxib  (Celebrex  200MG  Capsule) Pharmacy address;  [address] [place], [place] [address], Pharmacy telephone number, [phone]

## 2023-12-27 ENCOUNTER — Ambulatory Visit (INDEPENDENT_AMBULATORY_CARE_PROVIDER_SITE_OTHER)

## 2023-12-27 DIAGNOSIS — M76822 Posterior tibial tendinitis, left leg: Secondary | ICD-10-CM | POA: Diagnosis not present

## 2023-12-27 DIAGNOSIS — M76821 Posterior tibial tendinitis, right leg: Secondary | ICD-10-CM

## 2023-12-27 DIAGNOSIS — M2141 Flat foot [pes planus] (acquired), right foot: Secondary | ICD-10-CM

## 2023-12-27 NOTE — Progress Notes (Signed)
 Orthotics   Patient was present and evaluated for Custom molded foot orthotics. Patient will benefit from CFO's to provide total contact to BIL MLA's helping to balance and distribute body weight more evenly across BIL feet helping to reduce plantar pressure and pain. Orthotic will also encourage FF / RF alignment  Patient was scanned today and will return for fitting upon receipt

## 2024-01-11 ENCOUNTER — Telehealth: Payer: Self-pay | Admitting: Podiatry

## 2024-01-11 NOTE — Telephone Encounter (Signed)
 Called to notify orthotics are here and confirmed appt

## 2024-01-26 ENCOUNTER — Ambulatory Visit

## 2024-01-26 NOTE — Progress Notes (Signed)
 Patient presents today to pick up custom molded foot orthotics, diagnosed with PTT by Dr. Verta.   Orthotics were dispensed and fit was satisfactory. Reviewed instructions for break-in and wear. Written instructions given to patient.  Patient will follow up as needed.   Lolita Schultze CPed CFo, CFm

## 2024-05-02 ENCOUNTER — Other Ambulatory Visit: Payer: Self-pay | Admitting: Podiatry
